# Patient Record
Sex: Male | Born: 1985 | Race: Black or African American | Hispanic: No | Marital: Single | Smoking: Never smoker
Health system: Southern US, Community
[De-identification: ages and names within clinical notes are randomized; demographics above are authoritative.]

## PROBLEM LIST (undated history)

## (undated) DIAGNOSIS — R21 Rash and other nonspecific skin eruption: Secondary | ICD-10-CM

## (undated) DIAGNOSIS — H538 Other visual disturbances: Secondary | ICD-10-CM

## (undated) DIAGNOSIS — E785 Hyperlipidemia, unspecified: Secondary | ICD-10-CM

## (undated) DIAGNOSIS — J45909 Unspecified asthma, uncomplicated: Secondary | ICD-10-CM

## (undated) DIAGNOSIS — Z8619 Personal history of other infectious and parasitic diseases: Secondary | ICD-10-CM

## (undated) HISTORY — DX: Rash and other nonspecific skin eruption: R21

## (undated) HISTORY — DX: Other visual disturbances: H53.8

## (undated) HISTORY — DX: Personal history of other infectious and parasitic diseases: Z86.19

## (undated) HISTORY — DX: Hyperlipidemia, unspecified: E78.5

---
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
## 2006-07-28 ENCOUNTER — Ambulatory Visit: Payer: Self-pay | Admitting: Internal Medicine

## 2006-07-28 LAB — CONVERTED CEMR LAB
ALT: 20 units/L (ref 0–40)
AST: 23 units/L (ref 0–37)
Albumin: 4.5 g/dL (ref 3.5–5.2)
Alkaline Phosphatase: 51 units/L (ref 39–117)
BUN: 13 mg/dL (ref 6–23)
Basophils Absolute: 0 10*3/uL (ref 0.0–0.1)
Basophils Relative: 0.5 % (ref 0.0–1.0)
Bilirubin Urine: NEGATIVE
CO2: 30 meq/L (ref 19–32)
Calcium: 9.6 mg/dL (ref 8.4–10.5)
Chloride: 103 meq/L (ref 96–112)
Creatinine, Ser: 1.3 mg/dL (ref 0.4–1.5)
Crystals: NEGATIVE
Eosinophil percent: 0.8 % (ref 0.0–5.0)
GFR calc non Af Amer: 76 mL/min
Glomerular Filtration Rate, Af Am: 91 mL/min/{1.73_m2}
Glucose, Bld: 99 mg/dL (ref 70–99)
HCT: 48.5 % (ref 39.0–52.0)
Hemoglobin, Urine: NEGATIVE
Hemoglobin: 16.2 g/dL (ref 13.0–17.0)
Ketones, ur: NEGATIVE mg/dL
Leukocytes, UA: NEGATIVE
Lymphocytes Relative: 22.3 % (ref 12.0–46.0)
MCHC: 33.3 g/dL (ref 30.0–36.0)
MCV: 96.9 fL (ref 78.0–100.0)
Monocytes Absolute: 0.3 10*3/uL (ref 0.2–0.7)
Monocytes Relative: 4 % (ref 3.0–11.0)
Mucus, UA: NEGATIVE
Neutro Abs: 5.6 10*3/uL (ref 1.4–7.7)
Neutrophils Relative %: 72.4 % (ref 43.0–77.0)
Nitrite: NEGATIVE
Platelets: 224 10*3/uL (ref 150–400)
Potassium: 4.1 meq/L (ref 3.5–5.1)
RBC: 5.01 M/uL (ref 4.22–5.81)
RDW: 12 % (ref 11.5–14.6)
Sodium: 140 meq/L (ref 135–145)
Specific Gravity, Urine: 1.025 (ref 1.000–1.03)
TSH: 1.13 microintl units/mL (ref 0.35–5.50)
Total Bilirubin: 1.6 mg/dL — ABNORMAL HIGH (ref 0.3–1.2)
Total Protein, Urine: NEGATIVE mg/dL
Total Protein: 7.5 g/dL (ref 6.0–8.3)
Urine Glucose: NEGATIVE mg/dL
Urobilinogen, UA: 0.2 (ref 0.0–1.0)
WBC: 7.7 10*3/uL (ref 4.5–10.5)
pH: 7 (ref 5.0–8.0)

## 2006-08-02 ENCOUNTER — Encounter: Admission: RE | Admit: 2006-08-02 | Discharge: 2006-08-02 | Payer: Self-pay | Admitting: Internal Medicine

## 2006-12-08 ENCOUNTER — Ambulatory Visit: Payer: Self-pay | Admitting: Internal Medicine

## 2006-12-08 LAB — CONVERTED CEMR LAB
ALT: 23 units/L (ref 0–40)
AST: 22 units/L (ref 0–37)
Albumin: 4.1 g/dL (ref 3.5–5.2)
Alkaline Phosphatase: 47 units/L (ref 39–117)
BUN: 14 mg/dL (ref 6–23)
Bacteria, UA: NEGATIVE
Basophils Absolute: 0 10*3/uL (ref 0.0–0.1)
Basophils Relative: 0 % (ref 0.0–1.0)
Bilirubin, Direct: 0.2 mg/dL (ref 0.0–0.3)
CO2: 31 meq/L (ref 19–32)
Calcium: 9.5 mg/dL (ref 8.4–10.5)
Chloride: 102 meq/L (ref 96–112)
Cholesterol: 164 mg/dL (ref 0–200)
Creatinine, Ser: 1.3 mg/dL (ref 0.4–1.5)
Crystals: NEGATIVE
Eosinophils Absolute: 0.1 10*3/uL (ref 0.0–0.6)
Eosinophils Relative: 0.6 % (ref 0.0–5.0)
GFR calc Af Amer: 91 mL/min
GFR calc non Af Amer: 75 mL/min
Glucose, Bld: 99 mg/dL (ref 70–99)
HCT: 50.3 % (ref 39.0–52.0)
HDL: 50.7 mg/dL (ref >39.0)
Hemoglobin: 16.9 g/dL (ref 13.0–17.0)
Ketones, ur: NEGATIVE mg/dL
LDL Cholesterol: 105 mg/dL — ABNORMAL HIGH (ref 0–99)
Leukocytes, UA: NEGATIVE
Lymphocytes Relative: 15.1 % (ref 12.0–46.0)
MCHC: 33.6 g/dL (ref 30.0–36.0)
MCV: 96.8 fL (ref 78.0–100.0)
Monocytes Absolute: 0.7 10*3/uL (ref 0.2–0.7)
Monocytes Relative: 6.1 % (ref 3.0–11.0)
Neutro Abs: 8.7 10*3/uL — ABNORMAL HIGH (ref 1.4–7.7)
Neutrophils Relative %: 78.2 % — ABNORMAL HIGH (ref 43.0–77.0)
Nitrite: NEGATIVE
Platelets: 218 10*3/uL (ref 150–400)
Potassium: 4.4 meq/L (ref 3.5–5.1)
RBC: 5.2 M/uL (ref 4.22–5.81)
RDW: 12.1 % (ref 11.5–14.6)
Sodium: 141 meq/L (ref 135–145)
Specific Gravity, Urine: >=1.03 (ref 1.000–1.03)
TSH: 1.25 microintl units/mL (ref 0.35–5.50)
Total Bilirubin: 1.3 mg/dL — ABNORMAL HIGH (ref 0.3–1.2)
Total CHOL/HDL Ratio: 3.2
Total Protein, Urine: 30 mg/dL — AB
Total Protein: 7.5 g/dL (ref 6.0–8.3)
Triglycerides: 40 mg/dL (ref 0–149)
Urine Glucose: NEGATIVE mg/dL
Urobilinogen, UA: 0.2 (ref 0.0–1.0)
VLDL: 8 mg/dL (ref 0–40)
WBC, UA: NONE SEEN cells/hpf
WBC: 11.2 10*3/uL — ABNORMAL HIGH (ref 4.5–10.5)
pH: 6 (ref 5.0–8.0)

## 2007-01-07 ENCOUNTER — Ambulatory Visit: Payer: Self-pay | Admitting: Internal Medicine

## 2007-01-07 LAB — CONVERTED CEMR LAB: Mono Screen: POSITIVE — AB

## 2007-05-27 ENCOUNTER — Encounter: Payer: Self-pay | Admitting: *Deleted

## 2007-05-27 DIAGNOSIS — Z8619 Personal history of other infectious and parasitic diseases: Secondary | ICD-10-CM

## 2007-05-27 HISTORY — DX: Personal history of other infectious and parasitic diseases: Z86.19

## 2009-06-29 ENCOUNTER — Ambulatory Visit: Payer: Self-pay | Admitting: Diagnostic Radiology

## 2009-06-29 ENCOUNTER — Telehealth: Payer: Self-pay | Admitting: Internal Medicine

## 2009-06-29 ENCOUNTER — Emergency Department (HOSPITAL_BASED_OUTPATIENT_CLINIC_OR_DEPARTMENT_OTHER): Admission: EM | Admit: 2009-06-29 | Discharge: 2009-06-29 | Payer: Self-pay | Admitting: Emergency Medicine

## 2010-09-15 ENCOUNTER — Ambulatory Visit: Payer: Self-pay | Admitting: Internal Medicine

## 2010-09-15 ENCOUNTER — Encounter: Payer: Self-pay | Admitting: Internal Medicine

## 2010-09-15 DIAGNOSIS — H538 Other visual disturbances: Secondary | ICD-10-CM

## 2010-09-15 DIAGNOSIS — E785 Hyperlipidemia, unspecified: Secondary | ICD-10-CM | POA: Insufficient documentation

## 2010-09-15 DIAGNOSIS — R21 Rash and other nonspecific skin eruption: Secondary | ICD-10-CM

## 2010-09-15 HISTORY — DX: Hyperlipidemia, unspecified: E78.5

## 2010-09-15 HISTORY — DX: Rash and other nonspecific skin eruption: R21

## 2010-09-15 HISTORY — DX: Other visual disturbances: H53.8

## 2010-09-15 LAB — CONVERTED CEMR LAB
Albumin: 4.6 g/dL (ref 3.5–5.2)
Alkaline Phosphatase: 50 units/L (ref 39–117)
Basophils Absolute: 0 10*3/uL (ref 0.0–0.1)
Bilirubin Urine: NEGATIVE
Bilirubin, Direct: 0.2 mg/dL (ref 0.0–0.3)
CO2: 31 meq/L (ref 19–32)
Calcium: 9.9 mg/dL (ref 8.4–10.5)
Creatinine, Ser: 1.2 mg/dL (ref 0.4–1.5)
Eosinophils Absolute: 0.1 10*3/uL (ref 0.0–0.7)
Glucose, Bld: 87 mg/dL (ref 70–99)
HDL: 58.8 mg/dL (ref >39.00)
Ketones, ur: NEGATIVE mg/dL
Leukocytes, UA: NEGATIVE
Lymphocytes Relative: 23.4 % (ref 12.0–46.0)
MCHC: 34.2 g/dL (ref 30.0–36.0)
Neutrophils Relative %: 70.2 % (ref 43.0–77.0)
Platelets: 240 10*3/uL (ref 150.0–400.0)
RDW: 12.9 % (ref 11.5–14.6)
TSH: 1.31 microintl units/mL (ref 0.35–5.50)
Total CHOL/HDL Ratio: 3
VLDL: 8 mg/dL (ref 0.0–40.0)
pH: 7 (ref 5.0–8.0)

## 2010-09-16 LAB — CONVERTED CEMR LAB: Chlamydia, Swab/Urine, PCR: NEGATIVE

## 2010-10-30 NOTE — Assessment & Plan Note (Signed)
Summary: NEW PT CXP/ TO RE EST/ LABS NEXT DAY/NWS  #   Vital Signs:  Patient profile:   25 year old male Height:      70 inches Weight:      187.75 pounds BMI:     27.04 O2 Sat:      98 % on Room air Temp:     97.9 degrees F oral Pulse rate:   62 / minute BP sitting:   110 / 80  (left arm) Cuff size:   regular  Vitals Entered By: Zella Ball Ewing CMA (AAMA) (September 15, 2010 1:11 PM)  O2 Flow:  Room air  CC: ADult Physical/RE   CC:  ADult Physical/RE.  History of Present Illness: here for wellness ;  Pt denies CP, worsening sob, doe, wheezing, orthopnea, pnd, worsening LE edema, palps, dizziness or syncope  Pt denies new neuro symptoms such as headache, facial or extremity weakness .  Pt denies polydipsia, polyuria.    Overall good compliance with meds, not reallt trying to follow low chol diet, wt stable, little excercise however .  Denies worsening depressive symptoms, suicidal ideation, or panic.   No fever, wt loss, night sweats, loss of appetite or other constitutional symptoms  Overall good compliance with meds, and good tolerability.  Pt states good ability with ADL's, low fall risk, home safety reviewed and adequate, no significant change in hearing or vision, trying to follow lower chol diet, and occasionally active only with regular excercise.  Does have "spots" to the vision bilat with getting a hug, but o/w no other compalints Has optho appt next wk  Preventive Screening-Counseling & Management  Alcohol-Tobacco     Smoking Status: never      Drug Use:  no.    Problems Prior to Update: 1)  Preventive Health Care  (ICD-V70.0) 2)  Blurred Vision, Intermittent  (ICD-368.8) 3)  Hyperlipidemia  (ICD-272.4) 4)  Rash and Other Nonspecific Skin Eruption  (ICD-782.1) 5)  Infectious Mononucleosis, Hx of  (ICD-V12.09)  Medications Prior to Update: 1)  None  Current Medications (verified): 1)  None  Allergies (verified): 1)  ! Penicillin  Past History:  Family  History: Last updated: 09/15/2010 father with DM grandparent with arthritis., heart disease, HTN, DM, stroke great uncle with ETOH brother s/p renal transplant  Social History: Last updated: 09/15/2010 work - Retail banker - Surveyor, [employer] - [employer] Single no children Alcohol use-yes - social Never Smoked Drug use-no  Risk Factors: Smoking Status: never (09/15/2010)  Past Medical History: hx of mono 2008 Hyperlipidemia  Past Surgical History: Reviewed history from 05/27/2007 and no changes required. Denies surgical history  Family History: Reviewed history and no changes required. father with DM grandparent with arthritis., heart disease, HTN, DM, stroke great uncle with ETOH brother s/p renal transplant  Social History: Reviewed history and no changes required. work - Pension scheme manager - [employer] Single no children Alcohol use-yes - social Never Smoked Drug use-no Drug Use:  no  Review of Systems  The patient denies anorexia, fever, vision loss, decreased hearing, hoarseness, chest pain, syncope, dyspnea on exertion, peripheral edema, prolonged cough, headaches, hemoptysis, abdominal pain, melena, hematochezia, severe indigestion/heartburn, hematuria, muscle weakness, suspicious skin lesions, transient blindness, difficulty walking, depression, unusual weight change, abnormal bleeding, enlarged lymph nodes, and angioedema.         all otherwise negative per pt -  except for recurring sore to glans penis in the past 6 mo, requests STD tests  Physical  Exam  General:  alert and overweight-appearing.   Head:  normocephalic and atraumatic.   Eyes:  vision grossly intact, pupils equal, pupils round, pupils reactive to light, and no optic disk abnormalities.   Ears:  R ear normal and L ear normal.   Nose:  no external deformity and no nasal discharge.   Mouth:  no gingival abnormalities and pharynx pink and moist.   Neck:  supple and no masses.    Lungs:  normal respiratory effort and normal breath sounds.   Heart:  normal rate and regular rhythm.   Abdomen:  soft, non-tender, and normal bowel sounds.   Genitalia:  Testes bilaterally descended without nodularity, tenderness or masses. No scrotal masses or lesions. No penis lesions or urethral discharge. Msk:  no joint tenderness and no joint swelling.   Extremities:  no edema, no erythema  Neurologic:  strength normal in all extremities, sensation intact to light touch, and gait normal.     Impression & Recommendations:  Problem # 1:  Preventive Health Care (ICD-V70.0) Overall doing well, age appropriate education and counseling updated, referral for preventive services and immunizations addressed, dietary counseling and smoking status adressed , most recent labs reviewed I have personally reviewed and have noted 1.The patient's medical and social history 2.Their use of alcohol, tobacco or illicit drugs 3.Their current medications and supplements 4. Functional ability including ADL's, fall risk, home safety risk, hearing & visual impairment  5.Diet and physical activities 6.Evidence for depression or mood disorders The patients weight, height, BMI  have been recorded in the chart I have made referrals, counseling and provided education to the patient based review of the above  Orders: TLB-BMP (Basic Metabolic Panel-BMET) (80048-METABOL) TLB-CBC Platelet - w/Differential (85025-CBCD) TLB-Hepatic/Liver Function Pnl (80076-HEPATIC) TLB-Lipid Panel (80061-LIPID) TLB-TSH (Thyroid Stimulating Hormone) (84443-TSH) TLB-Udip ONLY (81003-UDIP)  Problem # 2:  RASH AND OTHER NONSPECIFIC SKIN ERUPTION (ICD-782.1) no rash today, but recurring - for STD tests per pt reqeust Orders: T-HIV-1 (Screen) (95621) T-Syphilis Test (RPR) [id number]) T-Herpes Simplex Type 2 (62952-84132) T-Chlamydia & GC Probe, Urine (87491/[address])  Problem # 3:  BLURRED VISION, INTERMITTENT (ICD-368.8) ?  clinical signifcance - I enecourage pt to keep his optho appt next wk as planned  Other Orders: Tdap => 64yrs IM (44010) Admin 1st Vaccine (27253)  Patient Instructions: 1)  you had the tetanus shot today 2)  Please go to the Lab in the basement for your blood and/or urine tests today, including for the STD's 3)  Please call the number on the [facility] Card for results of your testing 4)  Please schedule a follow-up appointment in 1 year, or sooner if needed   Orders Added: 1)  T-HIV-1 (Screen) [86701] 2)  T-Syphilis Test (RPR) [66440-34742] 3)  T-Herpes Simplex Type 2 [86696-81071] 4)  T-Chlamydia & GC Probe, Urine [87491/[address]] 5)  Tdap => 15yrs IM [90715] 6)  Admin 1st Vaccine [90471] 7)  TLB-BMP (Basic Metabolic Panel-BMET) [80048-METABOL] 8)  TLB-CBC Platelet - w/Differential [85025-CBCD] 9)  TLB-Hepatic/Liver Function Pnl [80076-HEPATIC] 10)  TLB-Lipid Panel [80061-LIPID] 11)  TLB-TSH (Thyroid Stimulating Hormone) [84443-TSH] 12)  TLB-Udip ONLY [81003-UDIP] 13)  New Patient 18-39 years [99385]   Immunizations Administered:  Tetanus Vaccine:    Vaccine Type: Tdap    Site: left deltoid    Mfr: GlaxoSmithKline    Dose: 0.5 ml    Route: IM    Given by: Zella Ball Ewing CMA (AAMA)    Exp. Date: 07/17/2012    Lot #: [id number]  VIS given: 08/15/08 version given September 15, 2010.   Immunizations Administered:  Tetanus Vaccine:    Vaccine Type: Tdap    Site: left deltoid    Mfr: GlaxoSmithKline    Dose: 0.5 ml    Route: IM    Given by: Zella Ball Ewing CMA (AAMA)    Exp. Date: 07/17/2012    Lot #: [id number]    VIS given: 08/15/08 version given September 15, 2010.

## 2011-01-01 LAB — DIFFERENTIAL
Basophils Absolute: 0.1 10*3/uL (ref 0.0–0.1)
Basophils Relative: 1 % (ref 0–1)
Eosinophils Relative: 4 % (ref 0–5)
Monocytes Absolute: 0.4 10*3/uL (ref 0.1–1.0)

## 2011-01-01 LAB — BASIC METABOLIC PANEL
CO2: 31 mEq/L (ref 19–32)
Chloride: 102 mEq/L (ref 96–112)
Glucose, Bld: 86 mg/dL (ref 70–99)
Potassium: 4.4 mEq/L (ref 3.5–5.1)
Sodium: 141 mEq/L (ref 135–145)

## 2011-01-01 LAB — URINALYSIS, ROUTINE W REFLEX MICROSCOPIC
Bilirubin Urine: NEGATIVE
Hgb urine dipstick: NEGATIVE
Specific Gravity, Urine: 1.02 (ref 1.005–1.030)
Urobilinogen, UA: 0.2 mg/dL (ref 0.0–1.0)
pH: 8 (ref 5.0–8.0)

## 2011-01-01 LAB — CBC
HCT: 45.8 % (ref 39.0–52.0)
Hemoglobin: 15.6 g/dL (ref 13.0–17.0)
MCHC: 34.1 g/dL (ref 30.0–36.0)
MCV: 94.8 fL (ref 78.0–100.0)
RDW: 12.4 % (ref 11.5–15.5)

## 2011-02-19 ENCOUNTER — Emergency Department (HOSPITAL_BASED_OUTPATIENT_CLINIC_OR_DEPARTMENT_OTHER)
Admission: EM | Admit: 2011-02-19 | Discharge: 2011-02-19 | Disposition: A | Discharge status: Home / Self Care | Payer: 59 | Attending: Emergency Medicine | Admitting: Emergency Medicine

## 2011-02-19 DIAGNOSIS — L02419 Cutaneous abscess of limb, unspecified: Secondary | ICD-10-CM | POA: Insufficient documentation

## 2011-05-06 ENCOUNTER — Emergency Department (INDEPENDENT_AMBULATORY_CARE_PROVIDER_SITE_OTHER): Payer: 59

## 2011-05-06 ENCOUNTER — Emergency Department (HOSPITAL_BASED_OUTPATIENT_CLINIC_OR_DEPARTMENT_OTHER)
Admission: EM | Admit: 2011-05-06 | Discharge: 2011-05-07 | Disposition: A | Discharge status: Home / Self Care | Payer: 59 | Attending: Emergency Medicine | Admitting: Emergency Medicine

## 2011-05-06 ENCOUNTER — Encounter: Payer: Self-pay | Admitting: *Deleted

## 2011-05-06 DIAGNOSIS — Y92410 Unspecified street and highway as the place of occurrence of the external cause: Secondary | ICD-10-CM | POA: Insufficient documentation

## 2011-05-06 DIAGNOSIS — S20219A Contusion of unspecified front wall of thorax, initial encounter: Secondary | ICD-10-CM | POA: Insufficient documentation

## 2011-05-06 DIAGNOSIS — R079 Chest pain, unspecified: Secondary | ICD-10-CM

## 2011-05-06 DIAGNOSIS — R109 Unspecified abdominal pain: Secondary | ICD-10-CM
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
## 2011-05-07 LAB — URINE MICROSCOPIC-ADD ON

## 2011-05-07 LAB — URINALYSIS, ROUTINE W REFLEX MICROSCOPIC
Leukocytes, UA: NEGATIVE
Nitrite: NEGATIVE
Specific Gravity, Urine: 1.027 (ref 1.005–1.030)
Urobilinogen, UA: 0.2 mg/dL (ref 0.0–1.0)

## 2011-05-07 MED ORDER — OXYCODONE-ACETAMINOPHEN 5-325 MG PO TABS: 2.0000 | ORAL_TABLET | ORAL | Status: AC | PRN

## 2011-05-07 MED ORDER — DIAZEPAM 5 MG PO TABS: 5.0000 mg | ORAL_TABLET | Freq: Two times a day (BID) | ORAL | Status: AC

## 2011-05-07 MED ORDER — IBUPROFEN 600 MG PO TABS: 600.0000 mg | ORAL_TABLET | Freq: Four times a day (QID) | ORAL | Status: AC | PRN

## 2011-10-06 ENCOUNTER — Encounter: Payer: Self-pay | Admitting: Internal Medicine

## 2011-10-06 DIAGNOSIS — Z0001 Encounter for general adult medical examination with abnormal findings: Secondary | ICD-10-CM | POA: Insufficient documentation

## 2011-10-09 ENCOUNTER — Ambulatory Visit (INDEPENDENT_AMBULATORY_CARE_PROVIDER_SITE_OTHER): Payer: 59 | Admitting: Internal Medicine

## 2011-10-09 ENCOUNTER — Other Ambulatory Visit (INDEPENDENT_AMBULATORY_CARE_PROVIDER_SITE_OTHER): Payer: No Typology Code available for payment source

## 2011-10-09 VITALS — BP 128/86 | HR 62 | Temp 97.5°F | Wt 207.0 lb

## 2011-10-09 DIAGNOSIS — Z0000 Encounter for general adult medical examination without abnormal findings: Secondary | ICD-10-CM

## 2011-10-09 DIAGNOSIS — R062 Wheezing: Secondary | ICD-10-CM

## 2011-10-09 DIAGNOSIS — R42 Dizziness and giddiness: Secondary | ICD-10-CM | POA: Insufficient documentation

## 2011-10-09 DIAGNOSIS — J209 Acute bronchitis, unspecified: Secondary | ICD-10-CM | POA: Insufficient documentation

## 2011-10-09 LAB — BASIC METABOLIC PANEL
BUN: 11 mg/dL (ref 6–23)
CO2: 28 mEq/L (ref 19–32)
Calcium: 9.2 mg/dL (ref 8.4–10.5)
GFR: 104.94 mL/min (ref >60.00)
Glucose, Bld: 91 mg/dL (ref 70–99)
Potassium: 4.3 mEq/L (ref 3.5–5.1)
Sodium: 141 mEq/L (ref 135–145)

## 2011-10-09 LAB — HEPATIC FUNCTION PANEL
AST: 32 U/L (ref 0–37)
Albumin: 4.4 g/dL (ref 3.5–5.2)
Alkaline Phosphatase: 55 U/L (ref 39–117)
Total Protein: 7.4 g/dL (ref 6.0–8.3)

## 2011-10-09 LAB — CBC WITH DIFFERENTIAL/PLATELET
Basophils Absolute: 0 10*3/uL (ref 0.0–0.1)
Eosinophils Relative: 1 % (ref 0.0–5.0)
HCT: 44.5 % (ref 39.0–52.0)
Lymphocytes Relative: 29.2 % (ref 12.0–46.0)
Monocytes Relative: 6 % (ref 3.0–12.0)
Platelets: 211 10*3/uL (ref 150.0–400.0)
RDW: 13 % (ref 11.5–14.6)
WBC: 7.8 10*3/uL (ref 4.5–10.5)

## 2011-10-09 LAB — URINALYSIS, ROUTINE W REFLEX MICROSCOPIC
Leukocytes, UA: NEGATIVE
Nitrite: NEGATIVE
Specific Gravity, Urine: 1.02 (ref 1.000–1.030)
Urobilinogen, UA: 0.2 (ref 0.0–1.0)
pH: 7 (ref 5.0–8.0)

## 2011-10-09 LAB — TSH: TSH: 1.58 u[IU]/mL (ref 0.35–5.50)

## 2011-10-09 LAB — LIPID PANEL: Triglycerides: 75 mg/dL (ref 0.0–149.0)

## 2011-10-09 MED ORDER — AZITHROMYCIN 250 MG PO TABS: ORAL_TABLET | ORAL | Status: AC

## 2011-10-09 MED ORDER — PREDNISONE 10 MG PO TABS: 10.0000 mg | ORAL_TABLET | Freq: Every day | ORAL | Status: DC

## 2011-10-09 MED ORDER — HYDROCODONE-HOMATROPINE 5-1.5 MG/5ML PO SYRP: 5.0000 mL | ORAL_SOLUTION | Freq: Four times a day (QID) | ORAL | Status: AC | PRN

## 2011-10-09 NOTE — Patient Instructions (Signed)
Take all new medications as prescribed Continue all other medications as before Your EKG was OK today You will be contacted regarding the referral for: echocardiogram Please go to LAB in the Basement for the blood and/or urine tests to be done today Please call the phone number [phone] (the [employer]Tree System) for results of testing in 2-3 days;  When calling, simply dial the number, and when prompted enter the MRN number above (the Medical Record Number) and the # key, then the message should start. Please return in 3 weeks, or sooner if needed

## 2011-10-09 NOTE — Assessment & Plan Note (Addendum)
ECG reviewed as per emr, exam benign, ? Clinical significance, for echo, and most recent labs reviewwed with pt  Lab Results  Component Value Date   WBC 7.8 10/09/2011   HGB 15.3 10/09/2011   HCT 44.5 10/09/2011   PLT 211.0 10/09/2011   GLUCOSE 91 10/09/2011   CHOL 186 10/09/2011   TRIG 75.0 10/09/2011   HDL 45.00 10/09/2011   LDLCALC 126* 10/09/2011   ALT 47 10/09/2011   AST 32 10/09/2011   NA 141 10/09/2011   K 4.3 10/09/2011   CL 104 10/09/2011   CREATININE 1.1 10/09/2011   BUN 11 10/09/2011   CO2 28 10/09/2011   TSH 1.58 10/09/2011

## 2011-10-11 ENCOUNTER — Encounter: Payer: Self-pay | Admitting: Internal Medicine

## 2011-10-11 NOTE — Progress Notes (Signed)
  Subjective:    Patient ID: Ryan White, male    DOB: 1986-09-13, 26 y.o.   MRN: [id number]  HPI  Here with acute onset mild to mod 2-3 days ST, HA, general weakness and malaise, with prod cough greenish sputum, but Pt denies chest pain, increased sob or doe, wheezing, orthopnea, PND, increased LE swelling, palpitations, or syncope, except for onset wheezing today.  Pt denies new neurological symptoms such as new headache, or facial or extremity weakness or numbness   Pt denies polydipsia, polyuria.  Also with c/o ongoing unusual dizziness with hugs or anything that seems to increaese pressure to chest such as valsalva maneuver.  Has been over a yr, but more freq and severe lately, quite concenred,  No actual syncope, but each episode leads to "stars" in the vision, goes away in 1-2 minutes.  Nothing else makes better or worse.  No prior hx of CV dz. Past Medical History  Diagnosis Date  . BLURRED VISION, INTERMITTENT 09/15/2010  . HYPERLIPIDEMIA 09/15/2010  . INFECTIOUS MONONUCLEOSIS, HX OF 05/27/2007  . Rash and other nonspecific skin eruption 09/15/2010   No past surgical history on file.  reports that he has never smoked. He does not have any smokeless tobacco history on file. He reports that he does not drink alcohol or use illicit drugs. family history is not on file. Allergies  Allergen Reactions  . Penicillins Hives   Current Outpatient Prescriptions on File Prior to Visit  Medication Sig Dispense Refill  . Multiple Vitamin (MULTIVITAMIN) tablet Take 1 tablet by mouth daily.         Review of Systems Review of Systems  Constitutional: Negative for diaphoresis and unexpected weight change.  HENT: Negative for drooling and tinnitus.   Eyes: Negative for photophobia and visual disturbance.  Respiratory: Negative for choking and stridor.   Gastrointestinal: Negative for vomiting and blood in stool.  Genitourinary: Negative for hematuria and decreased urine volume.  Musculoskeletal:  Negative for gait problem.  Skin: Negative for color change and wound.  Neurological: Negative for tremors and numbness.  Psychiatric/Behavioral: Negative for decreased concentration. The patient is not hyperactive.       Objective:   Physical Exam BP 128/86  Pulse 62  Temp(Src) 97.5 F (36.4 C) (Oral)  Wt 207 lb (93.895 kg)  SpO2 97% Physical Exam  VS noted, mild ill Constitutional: Pt appears well-developed and well-nourished.  HENT: Head: Normocephalic.  Right Ear: External ear normal.  Left Ear: External ear normal.  Bilat tm's mild erythema.  Sinus nontender.  Pharynx mild erythema Eyes: Conjunctivae and EOM are normal. Pupils are equal, round, and reactive to light.  Neck: Normal range of motion. Neck supple.  Cardiovascular: Normal rate and regular rhythm.  No murmur noted or heave Pulmonary/Chest: Effort normal and breath sounds mild decreased, few wheeze bilat.  Abd:  Soft, NT, non-distended, + BS Neurological: Pt is alert. No cranial nerve deficit.  Skin: Skin is warm. No erythema.  Psychiatric: Pt behavior is normal. Thought content normal. 1+ nervous    Assessment & Plan:

## 2011-10-11 NOTE — Assessment & Plan Note (Signed)
Mild to mod, for predpack,  to f/u any worsening symptoms or concerns 

## 2011-10-11 NOTE — Assessment & Plan Note (Signed)
Mild to mod, for antibx course,  to f/u any worsening symptoms or concerns 

## 2011-10-19 ENCOUNTER — Ambulatory Visit (HOSPITAL_COMMUNITY): Payer: 59 | Attending: Cardiology | Admitting: Radiology

## 2011-10-19 DIAGNOSIS — R42 Dizziness and giddiness: Secondary | ICD-10-CM | POA: Insufficient documentation

## 2011-10-19 DIAGNOSIS — R0609 Other forms of dyspnea: Secondary | ICD-10-CM | POA: Insufficient documentation

## 2011-10-19 DIAGNOSIS — E785 Hyperlipidemia, unspecified: Secondary | ICD-10-CM | POA: Insufficient documentation

## 2011-10-19 DIAGNOSIS — R0602 Shortness of breath: Secondary | ICD-10-CM

## 2011-10-19 DIAGNOSIS — R0989 Other specified symptoms and signs involving the circulatory and respiratory systems: Secondary | ICD-10-CM | POA: Insufficient documentation

## 2011-11-16 ENCOUNTER — Encounter: Payer: Self-pay | Admitting: Internal Medicine

## 2011-11-16 ENCOUNTER — Ambulatory Visit (INDEPENDENT_AMBULATORY_CARE_PROVIDER_SITE_OTHER): Payer: 59 | Admitting: Internal Medicine

## 2011-11-16 ENCOUNTER — Other Ambulatory Visit (INDEPENDENT_AMBULATORY_CARE_PROVIDER_SITE_OTHER): Payer: 59

## 2011-11-16 VITALS — BP 94/72 | HR 57 | Temp 97.8°F | Ht 70.0 in | Wt 207.8 lb

## 2011-11-16 DIAGNOSIS — E785 Hyperlipidemia, unspecified: Secondary | ICD-10-CM

## 2011-11-16 DIAGNOSIS — Z0000 Encounter for general adult medical examination without abnormal findings: Secondary | ICD-10-CM

## 2011-11-16 DIAGNOSIS — J45909 Unspecified asthma, uncomplicated: Secondary | ICD-10-CM | POA: Insufficient documentation

## 2011-11-16 LAB — LIPID PANEL
LDL Cholesterol: 121 mg/dL — ABNORMAL HIGH (ref 0–99)
Total CHOL/HDL Ratio: 4
Triglycerides: 42 mg/dL (ref 0.0–149.0)

## 2011-11-16 MED ORDER — FLUTICASONE-SALMETEROL 250-50 MCG/DOSE IN AEPB: 1.0000 | INHALATION_SPRAY | Freq: Two times a day (BID) | RESPIRATORY_TRACT | Status: DC

## 2011-11-16 MED ORDER — ALBUTEROL SULFATE HFA 108 (90 BASE) MCG/ACT IN AERS: 2.0000 | INHALATION_SPRAY | Freq: Four times a day (QID) | RESPIRATORY_TRACT | Status: DC | PRN

## 2011-11-16 MED ORDER — MONTELUKAST SODIUM 10 MG PO TABS: 10.0000 mg | ORAL_TABLET | Freq: Every day | ORAL | Status: DC

## 2011-11-16 NOTE — Assessment & Plan Note (Addendum)
Mild uncontrolled, for cont'd low chol diet, tochekc lipids repeat

## 2011-11-16 NOTE — Assessment & Plan Note (Signed)
Mixed etiology it seems excericse but also intermittent, cant r/o anxiety as cause of dyspnea but seems less likely;  For advair and ventolin hfa prn, pt educated on admin today

## 2011-11-16 NOTE — Progress Notes (Signed)
Subjective:    Patient ID: Ryan White, male    DOB: 1986/01/24, 26 y.o.   MRN: [id number]  HPI  Here for wellness and f/u;  Overall doing ok;  Pt denies CP, worsening SOB, DOE, wheezing, orthopnea, PND, worsening LE edema, palpitations, dizziness or syncope, except for exercise as well as other wheezing/sob in the past 2-3 months.  Pt denies neurological change such as new Headache, facial or extremity weakness.  Pt denies polydipsia, polyuria, or low sugar symptoms. Pt states overall good compliance with treatment and medications, good tolerability, and trying to follow lower cholesterol diet.  Pt denies worsening depressive symptoms, suicidal ideation or panic. No fever, wt loss, night sweats, loss of appetite, or other constitutional symptoms.  Pt states good ability with ADL's, low fall risk, home safety reviewed and adequate, no significant changes in hearing or vision, and occasionally active with exercise.  Has really been trying to follow lower chol diet in the past month.  Past Medical History  Diagnosis Date  . BLURRED VISION, INTERMITTENT 09/15/2010  . HYPERLIPIDEMIA 09/15/2010  . INFECTIOUS MONONUCLEOSIS, HX OF 05/27/2007  . Rash and other nonspecific skin eruption 09/15/2010   No past surgical history on file.  reports that he has never smoked. He does not have any smokeless tobacco history on file. He reports that he does not drink alcohol or use illicit drugs. family history is not on file. Allergies  Allergen Reactions  . Penicillins Hives   Current Outpatient Prescriptions on File Prior to Visit  Medication Sig Dispense Refill  . Multiple Vitamin (MULTIVITAMIN) tablet Take 1 tablet by mouth daily.         Review of Systems Review of Systems  Constitutional: Negative for diaphoresis, activity change, appetite change and unexpected weight change.  HENT: Negative for hearing loss, ear pain, facial swelling, mouth sores and neck stiffness.   Eyes: Negative for pain, redness  and visual disturbance.  Respiratory: Negative for shortness of breath and wheezing.   Cardiovascular: Negative for chest pain and palpitations.  Gastrointestinal: Negative for diarrhea, blood in stool, abdominal distention and rectal pain.  Genitourinary: Negative for hematuria, flank pain and decreased urine volume.  Musculoskeletal: Negative for myalgias and joint swelling.  Skin: Negative for color change and wound.  Neurological: Negative for syncope and numbness.  Hematological: Negative for adenopathy.  Psychiatric/Behavioral: Negative for hallucinations, self-injury, decreased concentration and agitation.      Objective:   Physical Exam BP 94/72  Pulse 57  Temp(Src) 97.8 F (36.6 C) (Oral)  Ht 5\' 10"  (1.778 m)  Wt 207 lb 12.8 oz (94.257 kg)  BMI 29.82 kg/m2  SpO2 98% Physical Exam  VS noted Constitutional: Pt is oriented to person, place, and time. Appears well-developed and well-nourished.  HENT:  Head: Normocephalic and atraumatic.  Right Ear: External ear normal.  Left Ear: External ear normal.  Nose: Nose normal.  Mouth/Throat: Oropharynx is clear and moist.  Eyes: Conjunctivae and EOM are normal. Pupils are equal, round, and reactive to light.  Neck: Normal range of motion. Neck supple. No JVD present. No tracheal deviation present.  Cardiovascular: Normal rate, regular rhythm, normal heart sounds and intact distal pulses.   Pulmonary/Chest: Effort normal and breath sounds normal.  Abdominal: Soft. Bowel sounds are normal. There is no tenderness.  Musculoskeletal: Normal range of motion. Exhibits no edema.  Lymphadenopathy:  Has no cervical adenopathy.  Neurological: Pt is alert and oriented to person, place, and time. Pt has normal reflexes. No cranial nerve  deficit.  Skin: Skin is warm and dry. No rash noted.  Psychiatric:  Has  normal mood and affect. Behavior is normal. 1+ nervous    Assessment & Plan:

## 2011-11-16 NOTE — Assessment & Plan Note (Signed)

## 2011-11-16 NOTE — Patient Instructions (Addendum)
Please use the Advair sample twice per day on a regular basis for 2 wks until the sample is done Take all new medications as prescribed - the generic singulair every day OK to use the advair prescription on an ongoing basis, otherwise the singulair alone may be enough Please use the Ventolin HFA inhaler as needed with exercise or other wheezing as needed Please go to LAB in the Basement for the blood and/or urine tests to be done today Please call the phone number [phone] (the PhoneTree System) for results of testing in 2-3 days;  When calling, simply dial the number, and when prompted enter the MRN number above (the Medical Record Number) and the # key, then the message should start.

## 2014-02-07 ENCOUNTER — Ambulatory Visit (INDEPENDENT_AMBULATORY_CARE_PROVIDER_SITE_OTHER): Payer: 59 | Admitting: Internal Medicine

## 2014-02-07 ENCOUNTER — Encounter: Payer: Self-pay | Admitting: Internal Medicine

## 2014-02-07 VITALS — BP 114/70 | HR 67 | Temp 98.0°F | Ht 70.0 in | Wt 223.2 lb

## 2014-02-07 DIAGNOSIS — A64 Unspecified sexually transmitted disease: Secondary | ICD-10-CM

## 2014-02-07 DIAGNOSIS — J45909 Unspecified asthma, uncomplicated: Secondary | ICD-10-CM

## 2014-02-07 DIAGNOSIS — N452 Orchitis: Secondary | ICD-10-CM | POA: Insufficient documentation

## 2014-02-07 DIAGNOSIS — N453 Epididymo-orchitis: Secondary | ICD-10-CM

## 2014-02-07 DIAGNOSIS — Z0000 Encounter for general adult medical examination without abnormal findings: Secondary | ICD-10-CM

## 2014-02-07 DIAGNOSIS — I83893 Varicose veins of bilateral lower extremities with other complications: Secondary | ICD-10-CM | POA: Insufficient documentation

## 2014-02-07 MED ORDER — DOXYCYCLINE HYCLATE 100 MG PO TABS: 100.0000 mg | ORAL_TABLET | Freq: Two times a day (BID) | ORAL | Status: DC

## 2014-02-07 NOTE — Assessment & Plan Note (Signed)
Mild to mod, for antibx course,  to f/u any worsening symptoms or concerns 

## 2014-02-07 NOTE — Progress Notes (Signed)
   Subjective:    Patient ID: Ryan White, male    DOB: 06/02/1986, 28 y.o.   MRN: [id number]  HPI  Here with 2 wks onset mild bilat aching testicles with mild swelling, Denies urinary symptoms such as dysuria, frequency, urgency, flank pain, hematuria or n/v, fever, chills. Also has 2 areas of tender/pain superficial varicose veins to right lateral thigh and post right calf. Pt denies chest pain, increased sob or doe, wheezing, orthopnea, PND, increased LE swelling, palpitations, dizziness or syncope.   Pt denies polydipsia, polyuria,  Past Medical History  Diagnosis Date  . BLURRED VISION, INTERMITTENT 09/15/2010  . HYPERLIPIDEMIA 09/15/2010  . INFECTIOUS MONONUCLEOSIS, HX OF 05/27/2007  . Rash and other nonspecific skin eruption 09/15/2010   No past surgical history on file.  reports that he has never smoked. He does not have any smokeless tobacco history on file. He reports that he does not drink alcohol or use illicit drugs. family history is not on file. Allergies  Allergen Reactions  . Penicillins Hives   Current Outpatient Prescriptions on File Prior to Visit  Medication Sig Dispense Refill  . Multiple Vitamin (MULTIVITAMIN) tablet Take 1 tablet by mouth daily.        Marland Kitchen. albuterol (PROVENTIL HFA;VENTOLIN HFA) 108 (90 BASE) MCG/ACT inhaler Inhale 2 puffs into the lungs every 6 (six) hours as needed for wheezing.  1 Inhaler  5  . montelukast (SINGULAIR) 10 MG tablet Take 1 tablet (10 mg total) by mouth daily.  30 tablet  11   No current facility-administered medications on file prior to visit.   Review of Systems  Constitutional: Negative for unusual diaphoresis or other sweats  HENT: Negative for ringing in ear Eyes: Negative for double vision or worsening visual disturbance.  Respiratory: Negative for choking and stridor.   Gastrointestinal: Negative for vomiting or other signifcant bowel change Genitourinary: Negative for hematuria or decreased urine volume.    Musculoskeletal: Negative for other MSK pain or swelling Skin: Negative for color change and worsening wound.  Neurological: Negative for tremors and numbness other than noted  Psychiatric/Behavioral: Negative for decreased concentration or agitation other than above       Objective:   Physical Exam BP 114/70  Pulse 67  Temp(Src) 98 F (36.7 C) (Oral)  Ht 5\' 10"  (1.778 m)  Wt 223 lb 4 oz (101.266 kg)  BMI 32.03 kg/m2  SpO2 96% VS noted,  Constitutional: Pt appears well-developed, well-nourished.  HENT: Head: NCAT.  Right Ear: External ear normal.  Left Ear: External ear normal.  Eyes: . Pupils are equal, round, and reactive to light. Conjunctivae and EOM are normal Neck: Normal range of motion. Neck supple.  Cardiovascular: Normal rate and regular rhythm.   Pulmonary/Chest: Effort normal and breath sounds normal.  Neurological: Pt is alert. Not confused , motor grossly intact Skin: Skin is warm. No rash, 2 tender areas superfic varicosities right lateral thigh and right post thigh Psychiatric: Pt behavior is normal. No agitation.     Assessment & Plan:

## 2014-02-07 NOTE — Assessment & Plan Note (Signed)
stable overall by history and exam, recent data reviewed with pt, and pt to continue medical treatment as before,  to f/u any worsening symptoms or concerns SpO2 Readings from Last 3 Encounters:  02/07/14 96%  11/16/11 98%  10/09/11 97%

## 2014-02-07 NOTE — Progress Notes (Signed)
Pre visit review using our clinic review tool, if applicable. No additional management support is needed unless otherwise documented below in the visit note. 

## 2014-02-07 NOTE — Patient Instructions (Signed)
Please take all new medication as prescribed - the antibiotic  Please continue all other medications as before, and refills have been done if requested. Please have the pharmacy call with any other refills you may need.  You will be contacted regarding the referral for: vein clinic  Please return in 1 months, or sooner if needed, with lab done 3-5 days ahead

## 2014-02-07 NOTE — Assessment & Plan Note (Signed)
Continue pain, for vein clinic referral for symptomatic varicosities

## 2014-03-07 ENCOUNTER — Ambulatory Visit: Payer: 59 | Admitting: Internal Medicine

## 2014-03-14 ENCOUNTER — Encounter: Payer: Self-pay | Admitting: Internal Medicine

## 2014-03-14 ENCOUNTER — Other Ambulatory Visit (INDEPENDENT_AMBULATORY_CARE_PROVIDER_SITE_OTHER): Payer: 59

## 2014-03-14 ENCOUNTER — Ambulatory Visit (INDEPENDENT_AMBULATORY_CARE_PROVIDER_SITE_OTHER): Payer: 59 | Admitting: Internal Medicine

## 2014-03-14 VITALS — BP 102/66 | HR 73 | Temp 97.6°F | Ht 70.0 in | Wt 225.0 lb

## 2014-03-14 DIAGNOSIS — J45909 Unspecified asthma, uncomplicated: Secondary | ICD-10-CM

## 2014-03-14 DIAGNOSIS — A64 Unspecified sexually transmitted disease: Secondary | ICD-10-CM

## 2014-03-14 DIAGNOSIS — Z0000 Encounter for general adult medical examination without abnormal findings: Secondary | ICD-10-CM

## 2014-03-14 LAB — URINALYSIS, ROUTINE W REFLEX MICROSCOPIC
BILIRUBIN URINE: NEGATIVE
Ketones, ur: NEGATIVE
Leukocytes, UA: NEGATIVE
NITRITE: NEGATIVE
Specific Gravity, Urine: >=1.03 — AB (ref 1.000–1.030)
TOTAL PROTEIN, URINE-UPE24: NEGATIVE
Urine Glucose: NEGATIVE
Urobilinogen, UA: 0.2 (ref 0.0–1.0)
pH: 6 (ref 5.0–8.0)

## 2014-03-14 LAB — LIPID PANEL
CHOL/HDL RATIO: 5
Cholesterol: 173 mg/dL (ref 0–200)
HDL: 36.9 mg/dL — ABNORMAL LOW (ref >39.00)
LDL Cholesterol: 124 mg/dL — ABNORMAL HIGH (ref 0–99)
NONHDL: 136.1
Triglycerides: 62 mg/dL (ref 0.0–149.0)
VLDL: 12.4 mg/dL (ref 0.0–40.0)

## 2014-03-14 LAB — BASIC METABOLIC PANEL
BUN: 14 mg/dL (ref 6–23)
CALCIUM: 9.2 mg/dL (ref 8.4–10.5)
CHLORIDE: 106 meq/L (ref 96–112)
CO2: 30 mEq/L (ref 19–32)
CREATININE: 1.2 mg/dL (ref 0.4–1.5)
GFR: 96.86 mL/min (ref >60.00)
Glucose, Bld: 107 mg/dL — ABNORMAL HIGH (ref 70–99)
Potassium: 4 mEq/L (ref 3.5–5.1)
Sodium: 140 mEq/L (ref 135–145)

## 2014-03-14 LAB — HEPATIC FUNCTION PANEL
ALT: 34 U/L (ref 0–53)
AST: 34 U/L (ref 0–37)
Albumin: 4.1 g/dL (ref 3.5–5.2)
Alkaline Phosphatase: 55 U/L (ref 39–117)
BILIRUBIN TOTAL: 0.7 mg/dL (ref 0.2–1.2)
Bilirubin, Direct: 0.1 mg/dL (ref 0.0–0.3)
Total Protein: 7.2 g/dL (ref 6.0–8.3)

## 2014-03-14 LAB — CBC WITH DIFFERENTIAL/PLATELET
BASOS PCT: 0.3 % (ref 0.0–3.0)
Basophils Absolute: 0 10*3/uL (ref 0.0–0.1)
EOS PCT: 1.7 % (ref 0.0–5.0)
Eosinophils Absolute: 0.2 10*3/uL (ref 0.0–0.7)
HCT: 44.7 % (ref 39.0–52.0)
HEMOGLOBIN: 15.1 g/dL (ref 13.0–17.0)
Lymphocytes Relative: 21.3 % (ref 12.0–46.0)
Lymphs Abs: 2.1 10*3/uL (ref 0.7–4.0)
MCHC: 33.8 g/dL (ref 30.0–36.0)
MCV: 92.6 fl (ref 78.0–100.0)
MONO ABS: 0.4 10*3/uL (ref 0.1–1.0)
Monocytes Relative: 3.8 % (ref 3.0–12.0)
NEUTROS PCT: 72.9 % (ref 43.0–77.0)
Neutro Abs: 7.2 10*3/uL (ref 1.4–7.7)
Platelets: 261 10*3/uL (ref 150.0–400.0)
RBC: 4.83 Mil/uL (ref 4.22–5.81)
RDW: 13.1 % (ref 11.5–15.5)
WBC: 9.9 10*3/uL (ref 4.0–10.5)

## 2014-03-14 LAB — TSH: TSH: 1.47 u[IU]/mL (ref 0.35–4.50)

## 2014-03-14 MED ORDER — MONTELUKAST SODIUM 10 MG PO TABS: 10.0000 mg | ORAL_TABLET | Freq: Every day | ORAL | Status: DC

## 2014-03-14 NOTE — Assessment & Plan Note (Signed)

## 2014-03-14 NOTE — Progress Notes (Signed)
Subjective:    Patient ID: Ryan MaesJoshua J Beckles, male    DOB: 11/07/1985, 28 y.o.   MRN: [id number]  HPI  Here for wellness and f/u;  Overall doing ok;  Pt denies CP, worsening SOB, DOE, wheezing, orthopnea, PND, worsening LE edema, palpitations, dizziness or syncope.  Pt denies neurological change such as new headache, facial or extremity weakness.  Pt denies polydipsia, polyuria, or low sugar symptoms. Pt states overall good compliance with treatment and medications, good tolerability, and has been trying to follow lower cholesterol diet.  Pt denies worsening depressive symptoms, suicidal ideation or panic. No fever, night sweats, wt loss, loss of appetite, or other constitutional symptoms.  Pt states good ability with ADL's, has low fall risk, home safety reviewed and adequate, no other significant changes in hearing or vision, and only occasionally active with exercise. Has gained wt from 180 to now 225 over last few yrs, often working 12-14 hr days Past Medical History  Diagnosis Date  . BLURRED VISION, INTERMITTENT 09/15/2010  . HYPERLIPIDEMIA 09/15/2010  . INFECTIOUS MONONUCLEOSIS, HX OF 05/27/2007  . Rash and other nonspecific skin eruption 09/15/2010   No past surgical history on file.  reports that he has never smoked. He does not have any smokeless tobacco history on file. He reports that he does not drink alcohol or use illicit drugs. family history is not on file. Allergies  Allergen Reactions  . Penicillins Hives   Current Outpatient Prescriptions on File Prior to Visit  Medication Sig Dispense Refill  . Multiple Vitamin (MULTIVITAMIN) tablet Take 1 tablet by mouth daily.        Marland Kitchen. albuterol (PROVENTIL HFA;VENTOLIN HFA) 108 (90 BASE) MCG/ACT inhaler Inhale 2 puffs into the lungs every 6 (six) hours as needed for wheezing.  1 Inhaler  5  . montelukast (SINGULAIR) 10 MG tablet Take 1 tablet (10 mg total) by mouth daily.  30 tablet  11   No current facility-administered medications on  file prior to visit.   Review of Systems Constitutional: Negative for increased diaphoresis, other activity, appetite or other siginficant weight change  HENT: Negative for worsening hearing loss, ear pain, facial swelling, mouth sores and neck stiffness.   Eyes: Negative for other worsening pain, redness or visual disturbance.  Respiratory: Negative for shortness of breath and wheezing.   Cardiovascular: Negative for chest pain and palpitations.  Gastrointestinal: Negative for diarrhea, blood in stool, abdominal distention or other pain Genitourinary: Negative for hematuria, flank pain or change in urine volume.  Musculoskeletal: Negative for myalgias or other joint complaints.  Skin: Negative for color change and wound.  Neurological: Negative for syncope and numbness. other than noted Hematological: Negative for adenopathy. or other swelling Psychiatric/Behavioral: Negative for hallucinations, self-injury, decreased concentration or other worsening agitation.      Objective:   Physical Exam BP 102/66  Pulse 73  Temp(Src) 97.6 F (36.4 C) (Oral)  Ht 5\' 10"  (1.778 m)  Wt 225 lb (102.059 kg)  BMI 32.28 kg/m2  SpO2 95% VS noted,  Constitutional: Pt is oriented to person, place, and time. Appears well-developed and well-nourished.  Head: Normocephalic and atraumatic.  Right Ear: External ear normal.  Left Ear: External ear normal.  Nose: Nose normal.  Mouth/Throat: Oropharynx is clear and moist.  Eyes: Conjunctivae and EOM are normal. Pupils are equal, round, and reactive to light.  Neck: Normal range of motion. Neck supple. No JVD present. No tracheal deviation present.  Cardiovascular: Normal rate, regular rhythm, normal heart sounds and  intact distal pulses.   Pulmonary/Chest: Effort normal and breath sounds without rales or wheezing  Abdominal: Soft. Bowel sounds are normal. NT. No HSM  Musculoskeletal: Normal range of motion. Exhibits no edema.  Lymphadenopathy:  Has no  cervical adenopathy.  Neurological: Pt is alert and oriented to person, place, and time. Pt has normal reflexes. No cranial nerve deficit. Motor grossly intact Skin: Skin is warm and dry. No rash noted.  Psychiatric:  Has normal mood and affect. Behavior is normal.     Assessment & Plan:

## 2014-03-14 NOTE — Patient Instructions (Signed)
Please continue all other medications as before, and refills have been done if requested.  Please have the pharmacy call with any other refills you may need.  Please continue your efforts at being more active, low cholesterol diet, and weight control.  You are otherwise up to date with prevention measures today.  Please keep your appointments with your specialists as you may have planned  We will need to send you a letter regarding the tests that are still pending at this time.

## 2014-03-14 NOTE — Addendum Note (Signed)
Addended by: Corwin LevinsJOHN, JAMES W on: 03/14/2014 03:24 PM   Modules accepted: Orders

## 2014-03-14 NOTE — Progress Notes (Signed)
Pre visit review using our clinic review tool, if applicable. No additional management support is needed unless otherwise documented below in the visit note. 

## 2014-03-15 ENCOUNTER — Encounter: Payer: Self-pay | Admitting: Internal Medicine

## 2014-03-15 LAB — HEPATITIS PANEL, ACUTE
HCV AB: NEGATIVE
HEP B C IGM: NONREACTIVE
HEP B S AG: NEGATIVE
Hep A IgM: NONREACTIVE

## 2014-03-15 LAB — HSV 2 ANTIBODY, IGG

## 2014-03-15 LAB — RPR

## 2014-03-15 LAB — HIV ANTIBODY (ROUTINE TESTING W REFLEX): HIV 1&2 Ab, 4th Generation: NONREACTIVE

## 2014-03-17 LAB — GC/CHLAMYDIA PROBE AMP, URINE
Chlamydia, Swab/Urine, PCR: NEGATIVE
GC Probe Amp, Urine: NEGATIVE

## 2014-03-28 ENCOUNTER — Other Ambulatory Visit: Payer: Self-pay | Admitting: *Deleted

## 2014-03-28 DIAGNOSIS — I83893 Varicose veins of bilateral lower extremities with other complications: Secondary | ICD-10-CM

## 2014-03-29 ENCOUNTER — Encounter: Payer: 59 | Admitting: Vascular Surgery

## 2014-03-29 ENCOUNTER — Encounter (HOSPITAL_COMMUNITY): Payer: 59

## 2014-04-11 ENCOUNTER — Encounter (HOSPITAL_COMMUNITY): Payer: 59

## 2014-04-11 ENCOUNTER — Encounter: Payer: 59 | Admitting: Vascular Surgery

## 2014-05-15 ENCOUNTER — Encounter: Payer: Self-pay | Admitting: Vascular Surgery

## 2014-05-16 ENCOUNTER — Encounter: Payer: Self-pay | Admitting: Vascular Surgery

## 2014-05-16 ENCOUNTER — Ambulatory Visit (HOSPITAL_COMMUNITY)
Admission: RE | Admit: 2014-05-16 | Discharge: 2014-05-16 | Disposition: A | Discharge status: Home / Self Care | Payer: 59 | Source: Ambulatory Visit | Attending: Vascular Surgery | Admitting: Vascular Surgery

## 2014-05-16 ENCOUNTER — Ambulatory Visit (INDEPENDENT_AMBULATORY_CARE_PROVIDER_SITE_OTHER): Payer: 59 | Admitting: Vascular Surgery

## 2014-05-16 VITALS — BP 116/61 | HR 66 | Ht 70.0 in | Wt 225.0 lb

## 2014-05-16 DIAGNOSIS — I83893 Varicose veins of bilateral lower extremities with other complications: Secondary | ICD-10-CM | POA: Diagnosis not present

## 2014-05-16 NOTE — Progress Notes (Signed)
Referred by:  Corwin Levins, MD [address] [place], [place] [address]  Reason for referral: Swollen right leg  History of Present Illness  Ryan White is a 28 y.o. (1986/08/23) male who presents with chief complaint: swollen leg in the right thigh.  Patient notes, onset of swelling 16 months ago, associated with sitting for prolonged periods of.  The patient's symptoms include: swelling and pain with touch.  The patient has had no history of DVT,no history of varicose vein, no history of venous stasis ulcers, no history of  Lymphedema and no history of skin changes in lower legs.  There is no family history of venous disorders.  The patient has not used compression stockings in the past.  His past medical history includes Asthma.  He denise DM, hypertension and hypercholesterolemia.    Past Medical History  Diagnosis Date  . BLURRED VISION, INTERMITTENT 09/15/2010  . HYPERLIPIDEMIA 09/15/2010  . INFECTIOUS MONONUCLEOSIS, HX OF 05/27/2007  . Rash and other nonspecific skin eruption 09/15/2010    History reviewed. No pertinent past surgical history.  History   Social History  . Marital Status: Single    Spouse Name: N/A    Number of Children: N/A  . Years of Education: N/A   Occupational History  . Not on file.   Social History Main Topics  . Smoking status: Never Smoker   . Smokeless tobacco: Not on file  . Alcohol Use: No  . Drug Use: No  . Sexual Activity: No   Other Topics Concern  . Not on file   Social History Narrative  . No narrative on file    History reviewed. No pertinent family history. Father healthy Mother healthy  Current Outpatient Prescriptions on File Prior to Visit  Medication Sig Dispense Refill  . montelukast (SINGULAIR) 10 MG tablet Take 1 tablet (10 mg total) by mouth daily.  30 tablet  11  . Multiple Vitamin (MULTIVITAMIN) tablet Take 1 tablet by mouth daily.        Marland Kitchen albuterol (PROVENTIL HFA;VENTOLIN HFA) 108 (90 BASE) MCG/ACT  inhaler Inhale 2 puffs into the lungs every 6 (six) hours as needed for wheezing.  1 Inhaler  5   No current facility-administered medications on file prior to visit.    Allergies  Allergen Reactions  . Penicillins Hives      REVIEW OF SYSTEMS:  (Positives checked otherwise negative)  CARDIOVASCULAR:  []  chest pain, []  chest pressure, []  palpitations, []  shortness of breath when laying flat, []  shortness of breath with exertion,  []  pain in feet when walking, []  pain in feet when laying flat, []  history of blood clot in veins (DVT), []  history of phlebitis, []  swelling in legs, []  varicose veins  PULMONARY:  []  productive cough, [x]  asthma, []  wheezing  NEUROLOGIC:  []  weakness in arms or legs, []  numbness in arms or legs, []  difficulty speaking or slurred speech, []  temporary loss of vision in one eye, []  dizziness  HEMATOLOGIC:  []  bleeding problems, []  problems with blood clotting too easily  MUSCULOSKEL:  []  joint pain, []  joint swelling  GASTROINTEST:  []  vomiting blood, []  blood in stool     GENITOURINARY:  []  burning with urination, []  blood in urine  PSYCHIATRIC:  []  history of major depression  INTEGUMENTARY:  []  rashes, []  ulcers  CONSTITUTIONAL:  []  fever, []  chills   Physical Examination Filed Vitals:   05/16/14 1345  BP: 116/61  Pulse: 66  Height:  5\' 10"  (1.778 m)  Weight: 225 lb (102.059 kg)  SpO2: 98%   Body mass index is 32.28 kg/(m^2).  General: A&O x 3, WDWN  Head: Page  Ear/Nose/Throat: Hearing grossly intact, nares w/o erythema or drainage, oropharynx w/o Erythema/Exudate  Eyes: PERRLA  Neck: Supple, no nuchal rigidity, no palpable LAD  Pulmonary: Sym exp, good air movt, CTAB, no rales, rhonchi, & wheezing  Cardiac: RRR, Nl S1, S2, no Murmurs, rubs or gallops  Vascular: Vessel Right Left  Radial Palpable Palpable  Brachial Palpable Palpable  Carotid Palpable, without bruit Palpable, without bruit  Aorta Not palpable N/A  Femoral  Palpable Palpable  Popliteal Not palpable Not palpable  PT Palpable Palpable  DP Palpable Palpable   Gastrointestinal: soft, NTND  Musculoskeletal: M/S 5/5 throughout  Neurologic: CN 2-12 intact  Psychiatric: Judgment intact, Mood & affect appropriate for pt's clinical situation  Dermatologic: See M/S exam for extremity exam, no rashes otherwise noted.  Right lateral thigh palpable tender vein.    Lymph : No Cervical, Axillary, or Inguinal lymphadenopathy   Non-Invasive Vascular Imaging  BLE Venous Duplex (Date: 05/16/2014):   No DVT  Right Common femoral vein reflux   Medical Decision Making  Dyke MaesJoshua J Ulrich is a 28 y.o. male who presents with: RLE  "spider veins" dilated capularies.    Based on the patient's history and examination, Dr. Edilia Boickson recommend: Walking throughout his day, elevation at home with his legs above the level of his heart.  He can follow up as needed.  [facility] Office: [phone]  05/16/2014, 2:17 PM  Agree with above. He has some telangiectasias in his lateral right thigh and lateral right leg. There is no significant deep vein reflux or reflux in the greater saphenous vein. We have discussed the importance of intermittent leg elevation. He sits  A lot at work and I've encouraged him to get up and walk as much as possible.  Waverly Ferrarihristopher Dickson, MD, FACS Beeper [phone] 05/16/2014

## 2014-07-14 ENCOUNTER — Emergency Department (HOSPITAL_BASED_OUTPATIENT_CLINIC_OR_DEPARTMENT_OTHER)
Admission: EM | Admit: 2014-07-14 | Discharge: 2014-07-15 | Disposition: A | Discharge status: Home / Self Care | Payer: 59 | Attending: Emergency Medicine | Admitting: Emergency Medicine

## 2014-07-14 DIAGNOSIS — Z79899 Other long term (current) drug therapy: Secondary | ICD-10-CM | POA: Insufficient documentation

## 2014-07-14 DIAGNOSIS — R109 Unspecified abdominal pain: Secondary | ICD-10-CM | POA: Insufficient documentation

## 2014-07-14 DIAGNOSIS — Z8619 Personal history of other infectious and parasitic diseases: Secondary | ICD-10-CM | POA: Insufficient documentation

## 2014-07-14 DIAGNOSIS — J45909 Unspecified asthma, uncomplicated: Secondary | ICD-10-CM | POA: Diagnosis not present

## 2014-07-14 DIAGNOSIS — T7840XA Allergy, unspecified, initial encounter: Secondary | ICD-10-CM | POA: Diagnosis not present

## 2014-07-14 DIAGNOSIS — Z8639 Personal history of other endocrine, nutritional and metabolic disease: Secondary | ICD-10-CM | POA: Insufficient documentation

## 2014-07-14 DIAGNOSIS — R21 Rash and other nonspecific skin eruption: Secondary | ICD-10-CM | POA: Insufficient documentation

## 2014-07-14 DIAGNOSIS — F419 Anxiety disorder, unspecified: Secondary | ICD-10-CM | POA: Diagnosis not present

## 2014-07-14 DIAGNOSIS — R197 Diarrhea, unspecified: Secondary | ICD-10-CM | POA: Insufficient documentation

## 2014-07-14 HISTORY — DX: Unspecified asthma, uncomplicated: J45.909

## 2014-07-14 MED ORDER — METHYLPREDNISOLONE SODIUM SUCC 125 MG IJ SOLR
125.0000 mg | Freq: Once | INTRAMUSCULAR | Status: AC
  Administered 2014-07-15: 125 mg via INTRAMUSCULAR
  Filled 2014-07-14: qty 2

## 2014-07-14 MED ORDER — DIPHENHYDRAMINE HCL 25 MG PO CAPS
25.0000 mg | ORAL_CAPSULE | Freq: Once | ORAL | Status: AC
  Administered 2014-07-15: 25 mg via ORAL
  Filled 2014-07-14: qty 1

## 2014-07-14 MED ORDER — FAMOTIDINE 20 MG PO TABS
20.0000 mg | ORAL_TABLET | Freq: Once | ORAL | Status: AC
  Administered 2014-07-15: 20 mg via ORAL
  Filled 2014-07-14: qty 1

## 2014-07-15 ENCOUNTER — Encounter (HOSPITAL_BASED_OUTPATIENT_CLINIC_OR_DEPARTMENT_OTHER): Payer: Self-pay | Admitting: Emergency Medicine

## 2014-07-15 MED ORDER — PREDNISONE 20 MG PO TABS: ORAL_TABLET | ORAL | Status: DC

## 2014-07-15 MED ORDER — DIPHENHYDRAMINE HCL 25 MG PO TABS: 25.0000 mg | ORAL_TABLET | Freq: Four times a day (QID) | ORAL | Status: DC | PRN

## 2014-07-15 NOTE — Discharge Instructions (Signed)

## 2014-07-15 NOTE — ED Provider Notes (Signed)
CSN: [id number]     Arrival date & time 07/14/14  2340 History   First MD Initiated Contact with Patient 07/14/14 2352     Chief Complaint  Patient presents with  . Allergic Reaction     (Consider location/radiation/quality/duration/timing/severity/associated sxs/prior Treatment) HPI Patient presents with possible allergic reaction starting about one hour ago. He states the symptoms started with itching and burning to the bottoms of his feet and palms of his hand. He developed a raised rash to his trunk and her thighs. He also describes tongue tingling and sensation of throat swelling. He developed no wheezing or stridor. He also had some mild abdominal cramping and diarrhea. Since onset of his symptoms have dramatically improved. He currently has no respiratory symptoms. He denies any intraoral swelling. Rash has improved. No known new exposures. No sick contacts. Previous history of penicillin allergy. Past Medical History  Diagnosis Date  . BLURRED VISION, INTERMITTENT 09/15/2010  . HYPERLIPIDEMIA 09/15/2010  . INFECTIOUS MONONUCLEOSIS, HX OF 05/27/2007  . Rash and other nonspecific skin eruption 09/15/2010  . Asthma    History reviewed. No pertinent past surgical history. No family history on file. History  Substance Use Topics  . Smoking status: Never Smoker   . Smokeless tobacco: Not on file  . Alcohol Use: Yes     Comment: occasional     Review of Systems  Constitutional: Negative for fever and chills.  HENT: Negative for facial swelling, sore throat and trouble swallowing.   Respiratory: Negative for shortness of breath, wheezing and stridor.   Gastrointestinal: Positive for abdominal pain and diarrhea. Negative for nausea and vomiting.  Musculoskeletal: Negative for back pain, myalgias, neck pain and neck stiffness.  Skin: Positive for rash.  Neurological: Negative for dizziness, weakness, light-headedness, numbness and headaches.  All other systems reviewed and are  negative.     Allergies  Penicillins  Home Medications   Prior to Admission medications   Medication Sig Start Date End Date Taking? Authorizing Provider  albuterol (PROVENTIL HFA;VENTOLIN HFA) 108 (90 BASE) MCG/ACT inhaler Inhale 2 puffs into the lungs every 6 (six) hours as needed for wheezing. 11/16/11 11/15/12  Corwin LevinsJames W John, MD  montelukast (SINGULAIR) 10 MG tablet Take 1 tablet (10 mg total) by mouth daily. 03/14/14 03/14/15  Corwin LevinsJames W John, MD  Multiple Vitamin (MULTIVITAMIN) tablet Take 1 tablet by mouth daily.      Historical Provider, MD   BP 144/68  Pulse 73  Temp(Src) 98.1 F (36.7 C) (Oral)  Resp 16  Ht 5\' 10"  (1.778 m)  Wt 225 lb (102.059 kg)  BMI 32.28 kg/m2  SpO2 98% Physical Exam  Nursing note and vitals reviewed. Constitutional: He is oriented to person, place, and time. He appears well-developed and well-nourished. No distress.  Mildly anxious appearing  HENT:  Head: Normocephalic and atraumatic.  Mouth/Throat: Oropharynx is clear and moist. No oropharyngeal exudate.  The tongue or intraoral swelling. Speech is clear.  Eyes: EOM are normal. Pupils are equal, round, and reactive to light.  Neck: Normal range of motion. Neck supple.  Cardiovascular: Normal rate and regular rhythm.   Pulmonary/Chest: Effort normal and breath sounds normal. No stridor. No respiratory distress. He has no wheezes. He has no rales. He exhibits no tenderness.  Abdominal: Soft. Bowel sounds are normal. He exhibits no distension and no mass. There is no tenderness. There is no rebound and no guarding.  Musculoskeletal: Normal range of motion. He exhibits no edema and no tenderness.  Neurological: He is alert  and oriented to person, place, and time.  Skin: Skin is warm and dry. Rash noted. No erythema.  Erythematous plaques to thoracic back. Patient has erythematous macules to the palms of hands and the soles of feet.  Psychiatric: He has a normal mood and affect. His behavior is normal.     ED Course  Procedures (including critical care time) Labs Review Labs Reviewed - No data to display  Imaging Review No results found.   EKG Interpretation None      MDM   Final diagnoses:  None    Symptoms are consistent with with allergic reaction. No new known exposures. We'll treat and monitor in the emergency department. Patient's airway is intact with no intraoral swelling.  Observed 2 hours in the emergency department. Improvement of rash. Airway intact. Return precautions given  Loren Raceravid Pennie Vanblarcom, MD 07/15/14 [phone]

## 2014-07-15 NOTE — ED Notes (Signed)
C/o burning feeling to feet bilateral and hands bilateral. States his mouth felt tingling/itching. States he had hives to his legs and ant chest. Has not eaten anything today. Denies any new products. States he feels a little sob. resp even and unlabored. No distress. States he works at a [employer] and this happened after he shook his customers hand.

## 2014-11-08 ENCOUNTER — Ambulatory Visit (INDEPENDENT_AMBULATORY_CARE_PROVIDER_SITE_OTHER): Payer: Managed Care, Other (non HMO) | Admitting: Internal Medicine

## 2014-11-08 ENCOUNTER — Other Ambulatory Visit (INDEPENDENT_AMBULATORY_CARE_PROVIDER_SITE_OTHER): Payer: Managed Care, Other (non HMO)

## 2014-11-08 ENCOUNTER — Encounter: Payer: Self-pay | Admitting: Internal Medicine

## 2014-11-08 VITALS — BP 110/76 | HR 77 | Temp 98.6°F | Ht 70.0 in | Wt 230.0 lb

## 2014-11-08 DIAGNOSIS — R1030 Lower abdominal pain, unspecified: Secondary | ICD-10-CM | POA: Insufficient documentation

## 2014-11-08 DIAGNOSIS — I889 Nonspecific lymphadenitis, unspecified: Secondary | ICD-10-CM

## 2014-11-08 DIAGNOSIS — L509 Urticaria, unspecified: Secondary | ICD-10-CM

## 2014-11-08 DIAGNOSIS — R1031 Right lower quadrant pain: Secondary | ICD-10-CM

## 2014-11-08 LAB — BASIC METABOLIC PANEL
BUN: 16 mg/dL (ref 6–23)
CO2: 30 meq/L (ref 19–32)
CREATININE: 1.2 mg/dL (ref 0.40–1.50)
Calcium: 9.3 mg/dL (ref 8.4–10.5)
Chloride: 104 mEq/L (ref 96–112)
GFR: 92.7 mL/min (ref >60.00)
Glucose, Bld: 87 mg/dL (ref 70–99)
Potassium: 4.1 mEq/L (ref 3.5–5.1)
Sodium: 139 mEq/L (ref 135–145)

## 2014-11-08 LAB — URINALYSIS, ROUTINE W REFLEX MICROSCOPIC
Bilirubin Urine: NEGATIVE
KETONES UR: NEGATIVE
Leukocytes, UA: NEGATIVE
Nitrite: NEGATIVE
Specific Gravity, Urine: >=1.03 — AB (ref 1.000–1.030)
Total Protein, Urine: NEGATIVE
UROBILINOGEN UA: 0.2 (ref 0.0–1.0)
Urine Glucose: NEGATIVE
pH: 6 (ref 5.0–8.0)

## 2014-11-08 LAB — CBC WITH DIFFERENTIAL/PLATELET
Basophils Absolute: 0.1 10*3/uL (ref 0.0–0.1)
Basophils Relative: 0.5 % (ref 0.0–3.0)
EOS PCT: 0.4 % (ref 0.0–5.0)
Eosinophils Absolute: 0.1 10*3/uL (ref 0.0–0.7)
HCT: 45.4 % (ref 39.0–52.0)
HEMOGLOBIN: 15.9 g/dL (ref 13.0–17.0)
LYMPHS ABS: 3.1 10*3/uL (ref 0.7–4.0)
Lymphocytes Relative: 24.2 % (ref 12.0–46.0)
MCHC: 35.1 g/dL (ref 30.0–36.0)
MCV: 91.5 fl (ref 78.0–100.0)
Monocytes Absolute: 0.6 10*3/uL (ref 0.1–1.0)
Monocytes Relative: 5 % (ref 3.0–12.0)
NEUTROS ABS: 9 10*3/uL — AB (ref 1.4–7.7)
Neutrophils Relative %: 69.9 % (ref 43.0–77.0)
PLATELETS: 250 10*3/uL (ref 150.0–400.0)
RBC: 4.97 Mil/uL (ref 4.22–5.81)
RDW: 13.5 % (ref 11.5–15.5)
WBC: 12.8 10*3/uL — ABNORMAL HIGH (ref 4.0–10.5)

## 2014-11-08 LAB — HEPATIC FUNCTION PANEL
ALT: 35 U/L (ref 0–53)
AST: 29 U/L (ref 0–37)
Albumin: 4.4 g/dL (ref 3.5–5.2)
Alkaline Phosphatase: 53 U/L (ref 39–117)
BILIRUBIN DIRECT: 0.3 mg/dL (ref 0.0–0.3)
BILIRUBIN TOTAL: 1.3 mg/dL — AB (ref 0.2–1.2)
TOTAL PROTEIN: 7.2 g/dL (ref 6.0–8.3)

## 2014-11-08 LAB — LIPASE: Lipase: 12 U/L (ref 11.0–59.0)

## 2014-11-08 MED ORDER — METHYLPREDNISOLONE ACETATE 40 MG/ML INJ SUSP (RADIOLOG: 120.0000 mg | Freq: Once | INTRAMUSCULAR | Status: DC

## 2014-11-08 MED ORDER — METHYLPREDNISOLONE ACETATE 80 MG/ML IJ SUSP
80.0000 mg | Freq: Once | INTRAMUSCULAR | Status: AC
Start: 2014-11-08 — End: 2014-11-08
  Administered 2014-11-08: 80 mg via INTRAMUSCULAR

## 2014-11-08 MED ORDER — LEVOFLOXACIN 500 MG PO TABS: 500.0000 mg | ORAL_TABLET | Freq: Every day | ORAL | Status: DC

## 2014-11-08 MED ORDER — PREDNISONE 10 MG PO TABS: ORAL_TABLET | ORAL | Status: DC

## 2014-11-08 NOTE — Assessment & Plan Note (Addendum)
Pt with cellphone pic evidence of typical wheal and flare, with itching , ongoing x 3 wks, no obvious cause such as meds or food, for depomedrol IM today, predpac asd, consider allergy referral or derm referral, also for benadryl otc prn itching

## 2014-11-08 NOTE — Progress Notes (Signed)
Subjective:    Patient ID: Ryan White, male    DOB: August 25, 1986, 29 y.o.   MRN: [id number]  HPI  Here to fu with several c/o's, has hive like reaction ongoing for 3 wks, seen in ER oct 18 with an allergic rxn as well, but has several pictures to document typical wheal and flare rash, usually for some reason comes up each evening, ususally in the sides of the abd at the mid axillary lines, but also today with a feeling of throat swelling, no real pain, no wheezing, sob or actual rash at this time, but does have right sided lymphadenitis like appearance with pre and post SCM LN chains tender, swelling without overlying skin change.  Also mentions what sounds like a recent spont drained cyst to right prox inner thigh now resolved, Also has a small area left medial thigh irritative rash that is different from other rash ? Due to thigh rubbing.  Also has 2 wks onset RLQ pain with decreased appetite without significant wt loss, mild nausea but no vomiting, no fever he is aware and Denies worsening reflux, other abd pain, dysphagia, bowel change or blood. No prior hx of same, no prior hx of appendix, diverticulitis, abscess or other.    Also incidentally has ongoing groin pain/testicle pain for many months, asks for urology referral Past Medical History  Diagnosis Date  . BLURRED VISION, INTERMITTENT 09/15/2010  . HYPERLIPIDEMIA 09/15/2010  . INFECTIOUS MONONUCLEOSIS, HX OF 05/27/2007  . Rash and other nonspecific skin eruption 09/15/2010  . Asthma    No past surgical history on file.  reports that he has never smoked. He does not have any smokeless tobacco history on file. He reports that he drinks alcohol. He reports that he does not use illicit drugs. family history is not on file. Allergies  Allergen Reactions  . Penicillins Hives   Current Outpatient Prescriptions on File Prior to Visit  Medication Sig Dispense Refill  . albuterol (PROVENTIL HFA;VENTOLIN HFA) 108 (90 BASE) MCG/ACT inhaler  Inhale 2 puffs into the lungs every 6 (six) hours as needed for wheezing. 1 Inhaler 5  . diphenhydrAMINE (BENADRYL) 25 MG tablet Take 1 tablet (25 mg total) by mouth every 6 (six) hours as needed for itching. 30 tablet 0  . montelukast (SINGULAIR) 10 MG tablet Take 1 tablet (10 mg total) by mouth daily. 30 tablet 11  . Multiple Vitamin (MULTIVITAMIN) tablet Take 1 tablet by mouth daily.       No current facility-administered medications on file prior to visit.   Review of Systems  Constitutional: Negative for unusual diaphoresis or other sweats  HENT: Negative for ringing in ear Eyes: Negative for double vision or worsening visual disturbance.  Respiratory: Negative for choking and stridor.   Gastrointestinal: Negative for vomiting or other signifcant bowel change Genitourinary: Negative for hematuria or decreased urine volume.  Musculoskeletal: Negative for other MSK pain or swelling Skin: Negative for color change and worsening wound.  Neurological: Negative for tremors and numbness other than noted  Psychiatric/Behavioral: Negative for decreased concentration or agitation other than above       Objective:   Physical Exam BP 110/76 mmHg  Pulse 77  Temp(Src) 98.6 F (37 C) (Oral)  Ht  (1.778 m)  Wt 230 lb (104.327 kg)  BMI 33.00 kg/m2 VS noted, feels somewhat warm to me Constitutional: Pt appears well-developed, well-nourished.  HENT: Head: NCAT.  Right Ear: External ear normal.  Left Ear: External ear normal.  Eyes: .  Pupils are equal, round, and reactive to light. Conjunctivae and EOM are normal Bilat tm's without erythema.  Max sinus areas non tender.  Pharynx with mild erythema, no exudate, ? Mild swelling Neck: Normal range of motion. Neck supple. but with mod right lymphadenitis tender large area about the right SCM pre and post chains.  Cardiovascular: Normal rate and regular rhythm.   Pulmonary/Chest: Effort normal and breath sounds without rales or wheezing.    Abd:  Soft, ND, + BS, but with at least mod tender RLQ to mild palpation only, no guarding or rebound Neurological: Pt is alert. Not confused , motor grossly intact Skin: Skin is warm. 2 cm area left medial thigh with slight raised mult small erythem rash with minor tender No active hives at this time Psychiatric: Pt behavior is normal. No agitation.     Assessment & Plan:

## 2014-11-08 NOTE — Progress Notes (Signed)
Pre visit review using our clinic review tool, if applicable. No additional management support is needed unless otherwise documented below in the visit note. 

## 2014-11-08 NOTE — Assessment & Plan Note (Addendum)
Etiology unclear, has some ? Pharyngeal swelling but no dental issue, for levaquin for this as well as help cover abd pain issue as well, for labs as documented

## 2014-11-08 NOTE — Patient Instructions (Addendum)
You had the steroid shot today  Please take all new medication as prescribed - the antibiotic, and prednisone  Please continue all other medications as before, including the benadryl as needed  Please have the pharmacy call with any other refills you may need.  Please keep your appointments with your specialists as you may have planned  Please go to the LAB in the Basement (turn left off the elevator) for the tests to be done today - Now  Then return to the First Floor to talk [facility] (patient care coordinator) to have the CT scheduled  Please consider referral to allergist if the hives keep coming back  You will be contacted regarding the referral for: urology

## 2014-11-08 NOTE — Assessment & Plan Note (Addendum)
Cant r/o appendix, abscess or other  - for stat Abd CT/pelvic CT with CM (with stat renal labs prior) and cbc, also for levaquin for now pending results (also tx for the neck lymphadenitis as well)  Note:  Total time for pt hx, exam, review of record with pt in the room, determination of diagnoses and plan for further eval and tx is > 40 min, with over 50% spent in coordination and counseling of patient

## 2014-11-09 ENCOUNTER — Telehealth: Payer: Self-pay | Admitting: *Deleted

## 2014-11-09 ENCOUNTER — Ambulatory Visit (INDEPENDENT_AMBULATORY_CARE_PROVIDER_SITE_OTHER)
Admission: RE | Admit: 2014-11-09 | Discharge: 2014-11-09 | Disposition: A | Discharge status: Home / Self Care | Payer: Managed Care, Other (non HMO) | Source: Ambulatory Visit | Attending: Internal Medicine | Admitting: Internal Medicine

## 2014-11-09 DIAGNOSIS — R1031 Right lower quadrant pain: Secondary | ICD-10-CM

## 2014-11-09 MED ORDER — IOHEXOL 300 MG/ML  SOLN
100.0000 mL | Freq: Once | INTRAMUSCULAR | Status: AC | PRN
  Administered 2014-11-09: 100 mL via INTRAVENOUS

## 2014-11-09 NOTE — Telephone Encounter (Signed)
Stacy from [facility] called to report that CT was negative for appendicitis

## 2014-11-13 ENCOUNTER — Telehealth: Payer: Self-pay | Admitting: Internal Medicine

## 2014-11-13 ENCOUNTER — Ambulatory Visit: Payer: Managed Care, Other (non HMO) | Admitting: Internal Medicine

## 2014-11-13 DIAGNOSIS — R1031 Right lower quadrant pain: Secondary | ICD-10-CM

## 2014-11-13 NOTE — Telephone Encounter (Signed)
Spoke with patient and reviewed his lab results. He is still having right lower dull abdominal pain.  He is also breaking out in hives. He has not yet started his prednisone or the antibiotic.  He will start those today. Please advise.

## 2014-11-13 NOTE — Telephone Encounter (Signed)
Pt request 2 test result that he did on 11/09/14. Please call pt

## 2014-11-13 NOTE — Telephone Encounter (Signed)
The next step is to refer to GI - I will do, thanks

## 2014-11-13 NOTE — Telephone Encounter (Signed)
Patient is aware 

## 2014-11-28 ENCOUNTER — Ambulatory Visit (INDEPENDENT_AMBULATORY_CARE_PROVIDER_SITE_OTHER): Payer: Managed Care, Other (non HMO) | Admitting: Gastroenterology

## 2014-11-28 ENCOUNTER — Other Ambulatory Visit (INDEPENDENT_AMBULATORY_CARE_PROVIDER_SITE_OTHER): Payer: Managed Care, Other (non HMO)

## 2014-11-28 ENCOUNTER — Encounter: Payer: Self-pay | Admitting: Gastroenterology

## 2014-11-28 VITALS — BP 102/76 | HR 84 | Ht 68.5 in | Wt 229.2 lb

## 2014-11-28 DIAGNOSIS — R1011 Right upper quadrant pain: Secondary | ICD-10-CM | POA: Diagnosis not present

## 2014-11-28 DIAGNOSIS — R1031 Right lower quadrant pain: Secondary | ICD-10-CM | POA: Diagnosis not present

## 2014-11-28 DIAGNOSIS — L509 Urticaria, unspecified: Secondary | ICD-10-CM | POA: Diagnosis not present

## 2014-11-28 LAB — CBC WITH DIFFERENTIAL/PLATELET
Basophils Absolute: 0 10*3/uL (ref 0.0–0.1)
Basophils Relative: 0.3 % (ref 0.0–3.0)
EOS ABS: 0.1 10*3/uL (ref 0.0–0.7)
Eosinophils Relative: 0.5 % (ref 0.0–5.0)
HEMATOCRIT: 47.1 % (ref 39.0–52.0)
HEMOGLOBIN: 16.3 g/dL (ref 13.0–17.0)
LYMPHS ABS: 2.3 10*3/uL (ref 0.7–4.0)
Lymphocytes Relative: 17 % (ref 12.0–46.0)
MCHC: 34.6 g/dL (ref 30.0–36.0)
MCV: 93.1 fl (ref 78.0–100.0)
Monocytes Absolute: 0.7 10*3/uL (ref 0.1–1.0)
Monocytes Relative: 4.9 % (ref 3.0–12.0)
NEUTROS ABS: 10.7 10*3/uL — AB (ref 1.4–7.7)
Neutrophils Relative %: 77.3 % — ABNORMAL HIGH (ref 43.0–77.0)
Platelets: 243 10*3/uL (ref 150.0–400.0)
RBC: 5.05 Mil/uL (ref 4.22–5.81)
RDW: 13.4 % (ref 11.5–15.5)
WBC: 13.8 10*3/uL — ABNORMAL HIGH (ref 4.0–10.5)

## 2014-11-28 LAB — IGA: IGA: 226 mg/dL (ref 68–378)

## 2014-11-28 NOTE — Patient Instructions (Addendum)
Your physician has requested that you go to the basement for the lab work before leaving today.   Please follow up with Doug SouJessica Zehr, P.A.-C. Or Dr. Rob Buntinganiel Jacobs in 4-6 weeks.     I appreciate the opportunity to care for you.

## 2014-11-28 NOTE — Progress Notes (Signed)
11/28/2014 Ryan White [id number] 1986/04/13   HISTORY OF PRESENT ILLNESS:  This is a pleasant 29 year old male who has been referred here by his PCP, Dr. Jonny Ruiz, for evaluation regarding RLQ abdominal pain.  The pain has been present for the past 3 weeks; came on suddenly.  It is described as a constant dull ache, and hurts most to push on that area or with movement.  No affected by BM's or eating.  He's also had some intermittent diarrhea over the past couple of weeks as well, but says that he has been eating a lot more fruit and greens so he contributes the diarrhea to that.  He has been seeing Dr. Jonny Ruiz for hives that have been coming and going daily for the past few weeks as well.  Was treated with a course of levaquin and some prednisone.  He had a CT scan of the abdomen and pelvis with contrast that showed benign appearing abdomen and pelvis, specifically normal appearing appendix and terminal ileum.  Lipase, BMP, and hepatic function panel are unremarkable.  CBC shows slightly elevated WBC count at 12.8.  Denies any rectal bleeding or other GI complaints.   Past Medical History  Diagnosis Date  . BLURRED VISION, INTERMITTENT 09/15/2010  . HYPERLIPIDEMIA 09/15/2010  . INFECTIOUS MONONUCLEOSIS, HX OF 05/27/2007  . Rash and other nonspecific skin eruption 09/15/2010  . Asthma    History reviewed. No pertinent past surgical history.  reports that he has never smoked. He has never used smokeless tobacco. He reports that he drinks alcohol. He reports that he does not use illicit drugs. family history includes Alzheimer's disease in his paternal grandmother; Breast cancer in his mother; Diabetes in his father; Heart disease in his paternal grandfather; Hyperlipidemia in his maternal grandfather; Kidney failure in his brother. Allergies  Allergen Reactions  . Penicillins Hives      Outpatient Encounter Prescriptions as of 11/28/2014  Medication Sig  . albuterol (PROVENTIL HFA;VENTOLIN  HFA) 108 (90 BASE) MCG/ACT inhaler Inhale 2 puffs into the lungs every 6 (six) hours as needed for wheezing.  . diphenhydrAMINE (BENADRYL) 25 MG tablet Take 1 tablet (25 mg total) by mouth every 6 (six) hours as needed for itching.  . montelukast (SINGULAIR) 10 MG tablet Take 1 tablet (10 mg total) by mouth daily.  . Multiple Vitamin (MULTIVITAMIN) tablet Take 1 tablet by mouth daily.    . [DISCONTINUED] levofloxacin (LEVAQUIN) 500 MG tablet Take 1 tablet (500 mg total) by mouth daily.  . [DISCONTINUED] predniSONE (DELTASONE) 10 MG tablet 4tab by mouth per day x 3 days,3tab x 3day,2tab x 3day,1tab x 3day     REVIEW OF SYSTEMS  : All other systems reviewed and negative except where noted in the History of Present Illness.   PHYSICAL EXAM: BP 102/76 mmHg  Pulse 84  Ht 5' 8.5" (1.74 m)  Wt 229 lb 4 oz (103.987 kg)  BMI 34.35 kg/m2 General: Well developed black male in no acute distress Head: Normocephalic and atraumatic Eyes:  Sclerae anicteric, conjunctiva pink. Ears: Normal auditory acuity Lungs: Clear throughout to auscultation Heart: Regular rate and rhythm Abdomen: Soft, non-distended.  Normal bowel sounds.  Mild RLQ TTP without R/R/G; abdomen benign Musculoskeletal: Symmetrical with no gross deformities  Skin: No lesions on visible extremities Extremities: No edema  Neurological: Alert oriented x 4, grossly non-focal Psychological:  Alert and cooperative. Normal mood and affect  ASSESSMENT AND PLAN: -29 year old male with 3 week history of RLQ  abdominal pain.  Also has some intermittent diarrhea, which he contributes to recent dietary changes with more fiber.  Concurrently he has been experiencing issues with hives.  CT scan and labs unremarkable.  ? If the abdominal pain is musculoskeletal.  Will check celiac labs to rule that out as an issue.  Will also check C3 and C4 complements to rule out hereditary angioedema.  I have offered him an anti-spasmodic, however, he declined.  He  will be scheduled for follow-up to see either myself or Dr. Christella HartiganJacobs back in 4-6 weeks to reassess.     CC:  Dr. Oliver BarreJames John

## 2014-11-29 LAB — C4 COMPLEMENT: C4 Complement: 18 mg/dL (ref 10–40)

## 2014-11-29 LAB — TISSUE TRANSGLUTAMINASE, IGA: Tissue Transglutaminase Ab, IgA: 1 U/mL (ref <4)

## 2014-11-29 LAB — C3 COMPLEMENT: C3 Complement: 152 mg/dL (ref 90–180)

## 2014-11-29 NOTE — Progress Notes (Signed)
I agree with the above note, plan 

## 2014-12-10 ENCOUNTER — Telehealth: Payer: Self-pay | Admitting: Internal Medicine

## 2014-12-10 DIAGNOSIS — L509 Urticaria, unspecified: Secondary | ICD-10-CM

## 2014-12-10 NOTE — Telephone Encounter (Signed)
Is stating he is still breaking out.  Would like to know what to do next.

## 2014-12-12 NOTE — Telephone Encounter (Signed)
Notified pt with md response.../lmb 

## 2014-12-12 NOTE — Telephone Encounter (Signed)
For allergy referral - will do  Cont benadryl 50 mg every 6 hrs as needed

## 2016-05-15 ENCOUNTER — Encounter: Payer: Self-pay | Admitting: Internal Medicine

## 2016-05-15 ENCOUNTER — Other Ambulatory Visit (INDEPENDENT_AMBULATORY_CARE_PROVIDER_SITE_OTHER): Payer: Managed Care, Other (non HMO)

## 2016-05-15 ENCOUNTER — Ambulatory Visit (INDEPENDENT_AMBULATORY_CARE_PROVIDER_SITE_OTHER): Payer: Managed Care, Other (non HMO) | Admitting: Internal Medicine

## 2016-05-15 VITALS — BP 130/70 | HR 86 | Temp 98.7°F | Resp 20 | Wt 209.0 lb

## 2016-05-15 DIAGNOSIS — J4520 Mild intermittent asthma, uncomplicated: Secondary | ICD-10-CM | POA: Diagnosis not present

## 2016-05-15 DIAGNOSIS — R6889 Other general symptoms and signs: Secondary | ICD-10-CM | POA: Diagnosis not present

## 2016-05-15 DIAGNOSIS — Z202 Contact with and (suspected) exposure to infections with a predominantly sexual mode of transmission: Secondary | ICD-10-CM

## 2016-05-15 DIAGNOSIS — B86 Scabies: Secondary | ICD-10-CM

## 2016-05-15 DIAGNOSIS — Z0001 Encounter for general adult medical examination with abnormal findings: Secondary | ICD-10-CM

## 2016-05-15 LAB — HEPATIC FUNCTION PANEL
ALK PHOS: 49 U/L (ref 39–117)
ALT: 28 U/L (ref 0–53)
AST: 22 U/L (ref 0–37)
Albumin: 4.8 g/dL (ref 3.5–5.2)
BILIRUBIN TOTAL: 1.1 mg/dL (ref 0.2–1.2)
Bilirubin, Direct: 0.3 mg/dL (ref 0.0–0.3)
Total Protein: 7.8 g/dL (ref 6.0–8.3)

## 2016-05-15 LAB — CBC WITH DIFFERENTIAL/PLATELET
BASOS PCT: 0.1 % (ref 0.0–3.0)
Basophils Absolute: 0 10*3/uL (ref 0.0–0.1)
EOS PCT: 0.2 % (ref 0.0–5.0)
Eosinophils Absolute: 0 10*3/uL (ref 0.0–0.7)
HCT: 45.5 % (ref 39.0–52.0)
Hemoglobin: 15.7 g/dL (ref 13.0–17.0)
LYMPHS ABS: 1.8 10*3/uL (ref 0.7–4.0)
Lymphocytes Relative: 15.3 % (ref 12.0–46.0)
MCHC: 34.5 g/dL (ref 30.0–36.0)
MCV: 92.5 fl (ref 78.0–100.0)
MONO ABS: 0.4 10*3/uL (ref 0.1–1.0)
Monocytes Relative: 3.5 % (ref 3.0–12.0)
NEUTROS ABS: 9.7 10*3/uL — AB (ref 1.4–7.7)
NEUTROS PCT: 80.9 % — AB (ref 43.0–77.0)
Platelets: 248 10*3/uL (ref 150.0–400.0)
RBC: 4.92 Mil/uL (ref 4.22–5.81)
RDW: 13.2 % (ref 11.5–15.5)
WBC: 12 10*3/uL — ABNORMAL HIGH (ref 4.0–10.5)

## 2016-05-15 LAB — BASIC METABOLIC PANEL
BUN: 12 mg/dL (ref 6–23)
CO2: 31 mEq/L (ref 19–32)
Calcium: 9.9 mg/dL (ref 8.4–10.5)
Chloride: 105 mEq/L (ref 96–112)
Creatinine, Ser: 1.21 mg/dL (ref 0.40–1.50)
GFR: 90.84 mL/min (ref >60.00)
GLUCOSE: 84 mg/dL (ref 70–99)
POTASSIUM: 4.1 meq/L (ref 3.5–5.1)
SODIUM: 143 meq/L (ref 135–145)

## 2016-05-15 LAB — URINALYSIS, ROUTINE W REFLEX MICROSCOPIC
BILIRUBIN URINE: NEGATIVE
Hgb urine dipstick: NEGATIVE
KETONES UR: NEGATIVE
Leukocytes, UA: NEGATIVE
Nitrite: NEGATIVE
PH: 8 (ref 5.0–8.0)
Specific Gravity, Urine: 1.015 (ref 1.000–1.030)
TOTAL PROTEIN, URINE-UPE24: 30 — AB
Urine Glucose: NEGATIVE
Urobilinogen, UA: 1 (ref 0.0–1.0)

## 2016-05-15 LAB — LIPID PANEL
CHOL/HDL RATIO: 4
CHOLESTEROL: 171 mg/dL (ref 0–200)
HDL: 43.7 mg/dL (ref >39.00)
LDL CALC: 112 mg/dL — AB (ref 0–99)
NonHDL: 127.39
Triglycerides: 79 mg/dL (ref 0.0–149.0)
VLDL: 15.8 mg/dL (ref 0.0–40.0)

## 2016-05-15 LAB — TSH: TSH: 1.4 u[IU]/mL (ref 0.35–4.50)

## 2016-05-15 MED ORDER — ALBUTEROL SULFATE HFA 108 (90 BASE) MCG/ACT IN AERS: 2.0000 | INHALATION_SPRAY | Freq: Four times a day (QID) | RESPIRATORY_TRACT | 5 refills | Status: DC | PRN

## 2016-05-15 MED ORDER — MONTELUKAST SODIUM 10 MG PO TABS: 10.0000 mg | ORAL_TABLET | Freq: Every day | ORAL | 3 refills | Status: DC

## 2016-05-15 MED ORDER — PERMETHRIN 5 % EX CREA: 1.0000 "application " | TOPICAL_CREAM | Freq: Once | CUTANEOUS | 0 refills | Status: AC

## 2016-05-15 NOTE — Progress Notes (Signed)
Pre visit review using our clinic review tool, if applicable. No additional management support is needed unless otherwise documented below in the visit note. 

## 2016-05-15 NOTE — Patient Instructions (Signed)
Please take all new medication as prescribed - the cream for the infection  Please wash all clothes on Hot cycle  Your fiancee may wish to be treated as well, as she might give it back to you  Please continue all other medications as before, and refills have been done if requested.  Please have the pharmacy call with any other refills you may need.  Please continue your efforts at being more active, low cholesterol diet, and weight control.  You are otherwise up to date with prevention measures today.  Please keep your appointments with your specialists as you may have planned  Please go to the LAB in the Basement (turn left off the elevator) for the tests to be done today  You will be contacted by phone if any changes need to be made immediately.  Otherwise, you will receive a letter about your results with an explanation, but please check with MyChart first.  Please remember to sign up for MyChart if you have not done so, as this will be important to you in the future with finding out test results, communicating by private email, and scheduling acute appointments online when needed.  Please return in 1 year for your yearly visit, or sooner if needed, with Lab testing done 3-5 days before

## 2016-05-15 NOTE — Progress Notes (Signed)
Subjective:    Patient ID: Ryan White, male    DOB: 09/27/1986, 30 y.o.   MRN: [id number]  HPI  Here for wellness and f/u;  Overall doing ok;  Pt denies Chest pain, worsening SOB, DOE, wheezing, orthopnea, PND, worsening LE edema, palpitations, dizziness or syncope.  Pt denies neurological change such as new headache, facial or extremity weakness.  Pt denies polydipsia, polyuria, or low sugar symptoms. Pt states overall good compliance with treatment and medications, good tolerability, and has been trying to follow appropriate diet.  Pt denies worsening depressive symptoms, suicidal ideation or panic. No fever, night sweats, wt loss, loss of appetite, or other constitutional symptoms.  Pt states good ability with ADL's, has low fall risk, home safety reviewed and adequate, no other significant changes in hearing or vision, and only occasionally active with exercise.  Declines immunizations.  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    Does also have new rash with itching and especially itchy areas with "dark spots" between the fingers, ongoing for over 1 wk, with girlfriend having the same thing.  Has never heard of scabies.  No fever.  Does also mention requests STD evaluation, has has other contacts unprotected he is unsure of.  Denies urinary symptoms such as dysuria, frequency, urgency, flank pain, hematuria or n/v, fever, chills.  No penile d/c or ulcer Past Medical History:  Diagnosis Date  . Asthma   . BLURRED VISION, INTERMITTENT 09/15/2010  . HYPERLIPIDEMIA 09/15/2010  . INFECTIOUS MONONUCLEOSIS, HX OF 05/27/2007  . Rash and other nonspecific skin eruption 09/15/2010   No past surgical history on file.  reports that he has never smoked. He has never used smokeless tobacco. He reports that he drinks alcohol. He reports that he does not use drugs. family history includes Alzheimer's disease in his paternal grandmother; Breast cancer in his mother; Diabetes  in his father; Heart disease in his paternal grandfather; Hyperlipidemia in his maternal grandfather; Kidney failure in his brother. Allergies  Allergen Reactions  . Penicillins Hives   Current Outpatient Prescriptions on File Prior to Visit  Medication Sig Dispense Refill  . diphenhydrAMINE (BENADRYL) 25 MG tablet Take 1 tablet (25 mg total) by mouth every 6 (six) hours as needed for itching. 30 tablet 0  . Multiple Vitamin (MULTIVITAMIN) tablet Take 1 tablet by mouth daily.       No current facility-administered medications on file prior to visit.    Review of Systems Constitutional: Negative for increased diaphoresis, or other activity, appetite or siginficant weight change other than noted HENT: Negative for worsening hearing loss, ear pain, facial swelling, mouth sores and neck stiffness.   Eyes: Negative for other worsening pain, redness or visual disturbance.  Respiratory: Negative for choking or stridor Cardiovascular: Negative for other chest pain and palpitations.  Gastrointestinal: Negative for worsening diarrhea, blood in stool, or abdominal distention Genitourinary: Negative for hematuria, flank pain or change in urine volume.  Musculoskeletal: Negative for myalgias or other joint complaints.  Skin: Negative for other color change and wound or drainage.  Neurological: Negative for syncope and numbness. other than noted Hematological: Negative for adenopathy. or other swelling Psychiatric/Behavioral: Negative for hallucinations, SI, self-injury, decreased concentration or other worsening agitation.      Objective:   Physical Exam BP 130/70   Pulse 86   Temp 98.7 F (37.1 C) (Oral)   Resp 20   Wt 209 lb (94.8 kg)   SpO2 98%   BMI  31.32 kg/m  VS noted,  Constitutional: Pt is oriented to person, place, and time. Appears well-developed and well-nourished, in no significant distress Head: Normocephalic and atraumatic  Eyes: Conjunctivae and EOM are normal. Pupils are  equal, round, and reactive to light Right Ear: External ear normal.  Left Ear: External ear normal Nose: Nose normal.  Mouth/Throat: Oropharynx is clear and moist  Neck: Normal range of motion. Neck supple. No JVD present. No tracheal deviation present or significant neck LA or mass Cardiovascular: Normal rate, regular rhythm, normal heart sounds and intact distal pulses.   Pulmonary/Chest: Effort normal and breath sounds without rales or wheezing  Abdominal: Soft. Bowel sounds are normal. NT. No HSM  Musculoskeletal: Normal range of motion. Exhibits no edema Lymphadenopathy: Has no cervical adenopathy.  Neurological: Pt is alert and oriented to person, place, and time. Pt has normal reflexes. No cranial nerve deficit. Motor grossly intact Skin: Skin is warm and dry, and + for scabies type rash with burrows between fingers and several erythem random lesions to extremiteis Psychiatric:  Has normal mood and affect. Behavior is normal.  GU: normal ext genitals without ulcer, rash, swelling or d/c    Assessment & Plan:

## 2016-05-16 LAB — HEPATITIS PANEL, ACUTE
HCV AB: NEGATIVE
HEP B S AG: NEGATIVE
Hep A IgM: NONREACTIVE
Hep B C IgM: NONREACTIVE

## 2016-05-16 LAB — GC/CHLAMYDIA PROBE AMP
CT Probe RNA: NOT DETECTED
GC PROBE AMP APTIMA: NOT DETECTED

## 2016-05-16 LAB — RPR

## 2016-05-16 LAB — HIV ANTIBODY (ROUTINE TESTING W REFLEX): HIV: NONREACTIVE

## 2016-05-16 NOTE — Assessment & Plan Note (Signed)

## 2016-05-16 NOTE — Assessment & Plan Note (Addendum)
Presumptive dx, for permethrin asd, urged all clothing wash, and girlfriend and any other contacts would need exam and possible tx  In addition to the time spent performing CPE, I spent an additional 25 minutes face to face,in which greater than 50% of this time was spent in counseling and coordination of care for patient's acute illness as documented.

## 2016-05-16 NOTE — Assessment & Plan Note (Signed)
Asympt, benign but with unprotected exposure, will need STD check

## 2016-05-16 NOTE — Assessment & Plan Note (Signed)
stable overall by history and exam, recent data reviewed with pt, and pt to continue medical treatment as before,  to f/u any worsening symptoms or concerns @LASTSAO2(3)@  

## 2016-05-18 LAB — HSV 2 ANTIBODY, IGG: HSV 2 Glycoprotein G Ab, IgG: <0.9 Index (ref <0.90)

## 2020-10-20 ENCOUNTER — Emergency Department (HOSPITAL_COMMUNITY)
Admission: EM | Admit: 2020-10-20 | Discharge: 2020-10-20 | Disposition: A | Discharge status: Home / Self Care | Payer: BC Managed Care – PPO | Attending: Emergency Medicine | Admitting: Emergency Medicine

## 2020-10-20 ENCOUNTER — Emergency Department (HOSPITAL_COMMUNITY): Payer: BC Managed Care – PPO

## 2020-10-20 ENCOUNTER — Other Ambulatory Visit: Payer: Self-pay

## 2020-10-20 DIAGNOSIS — Z23 Encounter for immunization: Secondary | ICD-10-CM | POA: Diagnosis not present

## 2020-10-20 DIAGNOSIS — Y92410 Unspecified street and highway as the place of occurrence of the external cause: Secondary | ICD-10-CM | POA: Diagnosis not present

## 2020-10-20 DIAGNOSIS — T1490XA Injury, unspecified, initial encounter: Secondary | ICD-10-CM

## 2020-10-20 DIAGNOSIS — R109 Unspecified abdominal pain: Secondary | ICD-10-CM | POA: Insufficient documentation

## 2020-10-20 DIAGNOSIS — S60940A Unspecified superficial injury of right index finger, initial encounter: Secondary | ICD-10-CM | POA: Diagnosis present

## 2020-10-20 DIAGNOSIS — M79604 Pain in right leg: Secondary | ICD-10-CM | POA: Diagnosis not present

## 2020-10-20 DIAGNOSIS — S3991XA Unspecified injury of abdomen, initial encounter: Secondary | ICD-10-CM | POA: Diagnosis not present

## 2020-10-20 DIAGNOSIS — M542 Cervicalgia: Secondary | ICD-10-CM | POA: Insufficient documentation

## 2020-10-20 DIAGNOSIS — R52 Pain, unspecified: Secondary | ICD-10-CM

## 2020-10-20 DIAGNOSIS — Z041 Encounter for examination and observation following transport accident: Secondary | ICD-10-CM | POA: Diagnosis not present

## 2020-10-20 DIAGNOSIS — M25511 Pain in right shoulder: Secondary | ICD-10-CM | POA: Diagnosis not present

## 2020-10-20 DIAGNOSIS — S61216A Laceration without foreign body of right little finger without damage to nail, initial encounter: Secondary | ICD-10-CM | POA: Diagnosis not present

## 2020-10-20 DIAGNOSIS — J45909 Unspecified asthma, uncomplicated: Secondary | ICD-10-CM | POA: Insufficient documentation

## 2020-10-20 DIAGNOSIS — S61210A Laceration without foreign body of right index finger without damage to nail, initial encounter: Secondary | ICD-10-CM | POA: Diagnosis not present

## 2020-10-20 LAB — CBC WITH DIFFERENTIAL/PLATELET
Abs Immature Granulocytes: 0.11 10*3/uL — ABNORMAL HIGH (ref 0.00–0.07)
Basophils Absolute: 0.1 10*3/uL (ref 0.0–0.1)
Basophils Relative: 1 %
Eosinophils Absolute: 0 10*3/uL (ref 0.0–0.5)
Eosinophils Relative: 0 %
HCT: 47 % (ref 39.0–52.0)
Hemoglobin: 15.3 g/dL (ref 13.0–17.0)
Immature Granulocytes: 1 %
Lymphocytes Relative: 25 %
Lymphs Abs: 3.2 10*3/uL (ref 0.7–4.0)
MCH: 30.9 pg (ref 26.0–34.0)
MCHC: 32.6 g/dL (ref 30.0–36.0)
MCV: 94.9 fL (ref 80.0–100.0)
Monocytes Absolute: 0.8 10*3/uL (ref 0.1–1.0)
Monocytes Relative: 6 %
Neutro Abs: 8.8 10*3/uL — ABNORMAL HIGH (ref 1.7–7.7)
Neutrophils Relative %: 67 %
Platelets: 252 10*3/uL (ref 150–400)
RBC: 4.95 MIL/uL (ref 4.22–5.81)
RDW: 12.7 % (ref 11.5–15.5)
WBC: 13 10*3/uL — ABNORMAL HIGH (ref 4.0–10.5)
nRBC: 0 % (ref 0.0–0.2)

## 2020-10-20 LAB — BASIC METABOLIC PANEL
Anion gap: 10 (ref 5–15)
BUN: 15 mg/dL (ref 6–20)
CO2: 24 mmol/L (ref 22–32)
Calcium: 9.2 mg/dL (ref 8.9–10.3)
Chloride: 105 mmol/L (ref 98–111)
Creatinine, Ser: 1.15 mg/dL (ref 0.61–1.24)
GFR, Estimated: >60 mL/min (ref >60)
Glucose, Bld: 114 mg/dL — ABNORMAL HIGH (ref 70–99)
Potassium: 4.1 mmol/L (ref 3.5–5.1)
Sodium: 139 mmol/L (ref 135–145)

## 2020-10-20 MED ORDER — NAPROXEN 500 MG PO TABS: 500.0000 mg | ORAL_TABLET | Freq: Two times a day (BID) | ORAL | 0 refills | Status: DC

## 2020-10-20 MED ORDER — IOHEXOL 350 MG/ML SOLN
100.0000 mL | Freq: Once | INTRAVENOUS | Status: AC | PRN
  Administered 2020-10-20: 100 mL via INTRAVENOUS

## 2020-10-20 MED ORDER — TETANUS-DIPHTH-ACELL PERTUSSIS 5-2.5-18.5 LF-MCG/0.5 IM SUSY
0.5000 mL | PREFILLED_SYRINGE | Freq: Once | INTRAMUSCULAR | Status: AC
  Administered 2020-10-20: 0.5 mL via INTRAMUSCULAR
  Filled 2020-10-20: qty 0.5

## 2020-10-20 MED ORDER — LIDOCAINE HCL (PF) 1 % IJ SOLN
5.0000 mL | Freq: Once | INTRAMUSCULAR | Status: AC
  Administered 2020-10-20: 5 mL
  Filled 2020-10-20: qty 5

## 2020-10-20 MED ORDER — METHOCARBAMOL 500 MG PO TABS: 500.0000 mg | ORAL_TABLET | Freq: Two times a day (BID) | ORAL | 0 refills | Status: AC
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
## 2020-10-20 NOTE — ED Notes (Signed)
Pt transported to Xray. 
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
## 2020-10-20 NOTE — Progress Notes (Signed)
Orthopedic Tech Progress Note Patient Details:  GERARDO CAIAZZO 25-Apr-1986 [id number]  Ortho Devices Type of Ortho Device: Finger splint Ortho Device/Splint Location: Right 2nd Finger Ortho Device/Splint Interventions: Ordered,Application   Post Interventions Patient Tolerated: Well Instructions Provided: Adjustment of device,Care of device,Poper ambulation with device   Jamorris Ndiaye Clarene Reamer 10/20/2020, 12:22 PM

## 2020-11-14 ENCOUNTER — Telehealth: Payer: Self-pay | Admitting: Internal Medicine

## 2020-11-14 NOTE — Telephone Encounter (Signed)
Ok with me 

## 2020-11-14 NOTE — Telephone Encounter (Signed)
Patient hasn't been seen since 08.18.2017, is it okay for him to re-establish care?

## 2020-11-15 NOTE — Telephone Encounter (Signed)
LVM for patient to call back to make an appointment.

## 2020-11-29 ENCOUNTER — Telehealth: Payer: Self-pay | Admitting: Internal Medicine

## 2020-11-29 ENCOUNTER — Ambulatory Visit (HOSPITAL_COMMUNITY)
Admission: RE | Admit: 2020-11-29 | Discharge: 2020-11-29 | Disposition: A | Discharge status: Home / Self Care | Payer: BC Managed Care – PPO | Source: Ambulatory Visit | Attending: Cardiology | Admitting: Cardiology

## 2020-11-29 ENCOUNTER — Ambulatory Visit: Payer: BC Managed Care – PPO | Admitting: Internal Medicine

## 2020-11-29 ENCOUNTER — Encounter: Payer: Self-pay | Admitting: Internal Medicine

## 2020-11-29 ENCOUNTER — Other Ambulatory Visit: Payer: Self-pay

## 2020-11-29 VITALS — BP 122/76 | HR 73 | Ht 68.5 in | Wt 235.0 lb

## 2020-11-29 DIAGNOSIS — E538 Deficiency of other specified B group vitamins: Secondary | ICD-10-CM | POA: Diagnosis not present

## 2020-11-29 DIAGNOSIS — Z202 Contact with and (suspected) exposure to infections with a predominantly sexual mode of transmission: Secondary | ICD-10-CM | POA: Diagnosis not present

## 2020-11-29 DIAGNOSIS — R739 Hyperglycemia, unspecified: Secondary | ICD-10-CM | POA: Diagnosis not present

## 2020-11-29 DIAGNOSIS — E7800 Pure hypercholesterolemia, unspecified: Secondary | ICD-10-CM

## 2020-11-29 DIAGNOSIS — M79661 Pain in right lower leg: Secondary | ICD-10-CM | POA: Diagnosis not present

## 2020-11-29 DIAGNOSIS — N452 Orchitis: Secondary | ICD-10-CM | POA: Diagnosis not present

## 2020-11-29 DIAGNOSIS — E559 Vitamin D deficiency, unspecified: Secondary | ICD-10-CM

## 2020-11-29 DIAGNOSIS — Z0001 Encounter for general adult medical examination with abnormal findings: Secondary | ICD-10-CM

## 2020-11-29 DIAGNOSIS — M7989 Other specified soft tissue disorders: Secondary | ICD-10-CM | POA: Diagnosis not present

## 2020-11-29 LAB — CBC WITH DIFFERENTIAL/PLATELET
Basophils Absolute: 0 10*3/uL (ref 0.0–0.1)
Basophils Relative: 0.5 % (ref 0.0–3.0)
Eosinophils Absolute: 0.1 10*3/uL (ref 0.0–0.7)
Eosinophils Relative: 1.1 % (ref 0.0–5.0)
HCT: 44.7 % (ref 39.0–52.0)
Hemoglobin: 15.3 g/dL (ref 13.0–17.0)
Lymphocytes Relative: 31.8 % (ref 12.0–46.0)
Lymphs Abs: 2.9 10*3/uL (ref 0.7–4.0)
MCHC: 34.2 g/dL (ref 30.0–36.0)
MCV: 92.8 fl (ref 78.0–100.0)
Monocytes Absolute: 0.5 10*3/uL (ref 0.1–1.0)
Monocytes Relative: 5.3 % (ref 3.0–12.0)
Neutro Abs: 5.7 10*3/uL (ref 1.4–7.7)
Neutrophils Relative %: 61.3 % (ref 43.0–77.0)
Platelets: 231 10*3/uL (ref 150.0–400.0)
RBC: 4.82 Mil/uL (ref 4.22–5.81)
RDW: 13.3 % (ref 11.5–15.5)
WBC: 9.2 10*3/uL (ref 4.0–10.5)

## 2020-11-29 LAB — LIPID PANEL
Cholesterol: 179 mg/dL (ref 0–200)
HDL: 43.5 mg/dL (ref >39.00)
LDL Cholesterol: 115 mg/dL — ABNORMAL HIGH (ref 0–99)
NonHDL: 135.56
Total CHOL/HDL Ratio: 4
Triglycerides: 102 mg/dL (ref 0.0–149.0)
VLDL: 20.4 mg/dL (ref 0.0–40.0)

## 2020-11-29 LAB — URINALYSIS, ROUTINE W REFLEX MICROSCOPIC
Bilirubin Urine: NEGATIVE
Ketones, ur: NEGATIVE
Leukocytes,Ua: NEGATIVE
Nitrite: NEGATIVE
Specific Gravity, Urine: 1.02 (ref 1.000–1.030)
Total Protein, Urine: NEGATIVE
Urine Glucose: NEGATIVE
Urobilinogen, UA: 0.2 (ref 0.0–1.0)
WBC, UA: NONE SEEN (ref 0–?)
pH: 6.5 (ref 5.0–8.0)

## 2020-11-29 LAB — BASIC METABOLIC PANEL
BUN: 16 mg/dL (ref 6–23)
CO2: 30 mEq/L (ref 19–32)
Calcium: 9.7 mg/dL (ref 8.4–10.5)
Chloride: 102 mEq/L (ref 96–112)
Creatinine, Ser: 1.11 mg/dL (ref 0.40–1.50)
GFR: 86.9 mL/min (ref >60.00)
Glucose, Bld: 89 mg/dL (ref 70–99)
Potassium: 4.1 mEq/L (ref 3.5–5.1)
Sodium: 141 mEq/L (ref 135–145)

## 2020-11-29 LAB — HEPATIC FUNCTION PANEL
ALT: 37 U/L (ref 0–53)
AST: 30 U/L (ref 0–37)
Albumin: 4.6 g/dL (ref 3.5–5.2)
Alkaline Phosphatase: 50 U/L (ref 39–117)
Bilirubin, Direct: 0.2 mg/dL (ref 0.0–0.3)
Total Bilirubin: 0.9 mg/dL (ref 0.2–1.2)
Total Protein: 7.7 g/dL (ref 6.0–8.3)

## 2020-11-29 LAB — VITAMIN B12: Vitamin B-12: 756 pg/mL (ref 211–911)

## 2020-11-29 LAB — HEMOGLOBIN A1C: Hgb A1c MFr Bld: 6.1 % (ref 4.6–6.5)

## 2020-11-29 LAB — VITAMIN D 25 HYDROXY (VIT D DEFICIENCY, FRACTURES): VITD: 24.38 ng/mL — ABNORMAL LOW (ref 30.00–100.00)

## 2020-11-29 LAB — TSH: TSH: 1.01 u[IU]/mL (ref 0.35–4.50)

## 2020-11-29 MED ORDER — DOXYCYCLINE HYCLATE 100 MG PO TABS: 100.0000 mg | ORAL_TABLET | Freq: Two times a day (BID) | ORAL | 0 refills | Status: AC

## 2020-11-29 NOTE — Telephone Encounter (Signed)
Ryan White from [facility] called to give the preliminary report-   negative DBT

## 2020-11-29 NOTE — Patient Instructions (Addendum)
Please take all new medication as prescribed - the antibiotic  Please continue all other medications as before, and refills have been done if requested.  Please have the pharmacy call with any other refills you may need.  Please continue your efforts at being more active, low cholesterol diet, and weight control.  You are otherwise up to date with prevention measures today.  Please keep your appointments with your specialists as you may have planned  You will be contacted regarding the referral for: right leg vein testing for blood clot - today if possible  Please go to the LAB at the blood drawing area for the tests to be done  You will be contacted by phone if any changes need to be made immediately.  Otherwise, you will receive a letter about your results with an explanation, but please check with MyChart first.  Please remember to sign up for MyChart if you have not done so, as this will be important to you in the future with finding out test results, communicating by private email, and scheduling acute appointments online when needed.  Please make an Appointment to return for your 1 year visit, or sooner if needed
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
## 2020-11-30 ENCOUNTER — Encounter: Payer: Self-pay | Admitting: Internal Medicine

## 2020-12-01 ENCOUNTER — Encounter: Payer: Self-pay | Admitting: Internal Medicine

## 2020-12-01 NOTE — Assessment & Plan Note (Signed)
Mild to mod, for antibx course,  to f/u any worsening symptoms or concerns 

## 2020-12-01 NOTE — Assessment & Plan Note (Signed)
Lab Results  Component Value Date   HGBA1C 6.1 11/29/2020   Stable, pt to continue current medical treatment - diet and wt control

## 2020-12-01 NOTE — Assessment & Plan Note (Signed)
Lab Results  Component Value Date   LDLCALC 115 (H) 11/29/2020   Stable, pt to continue current diet, declines statin for now

## 2020-12-01 NOTE — Assessment & Plan Note (Signed)
Possible - for STD testing
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
## 2020-12-01 NOTE — Assessment & Plan Note (Signed)
Age and sex appropriate education and counseling updated with regular exercise and diet Referrals for preventative services - none needed Immunizations addressed - declnies flu and covid vax Smoking counseling  - none needed Evidence for depression or other mood disorder - none significant Most recent labs reviewed. I have personally reviewed and have noted: 1) the patient's medical and social history 2) The patient's current medications and supplements 3) The patient's height, weight, and BMI have been recorded in the chart

## 2020-12-02 ENCOUNTER — Encounter: Payer: Self-pay | Admitting: Internal Medicine

## 2020-12-02 LAB — GC/CHLAMYDIA PROBE AMP
Chlamydia trachomatis, NAA: NEGATIVE
Neisseria Gonorrhoeae by PCR: NEGATIVE

## 2020-12-02 LAB — HSV 2 ANTIBODY, IGG: HSV 2 Glycoprotein G Ab, IgG: <0.9 index

## 2020-12-02 LAB — RPR: RPR Ser Ql: NONREACTIVE

## 2020-12-02 LAB — HIV ANTIBODY (ROUTINE TESTING W REFLEX): HIV 1&2 Ab, 4th Generation: NONREACTIVE
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
## 2023-12-29 ENCOUNTER — Encounter: Payer: Self-pay | Admitting: Podiatry

## 2023-12-29 ENCOUNTER — Ambulatory Visit (INDEPENDENT_AMBULATORY_CARE_PROVIDER_SITE_OTHER): Payer: Self-pay | Admitting: Podiatry

## 2023-12-29 ENCOUNTER — Ambulatory Visit (INDEPENDENT_AMBULATORY_CARE_PROVIDER_SITE_OTHER): Payer: Self-pay

## 2023-12-29 DIAGNOSIS — M722 Plantar fascial fibromatosis: Secondary | ICD-10-CM

## 2023-12-29 DIAGNOSIS — M21622 Bunionette of left foot: Secondary | ICD-10-CM

## 2023-12-29 DIAGNOSIS — M79672 Pain in left foot: Secondary | ICD-10-CM

## 2023-12-29 MED ORDER — METHYLPREDNISOLONE 4 MG PO TBPK: ORAL_TABLET | ORAL | 0 refills | Status: AC

## 2023-12-29 MED ORDER — MELOXICAM 15 MG PO TABS: 15.0000 mg | ORAL_TABLET | Freq: Every day | ORAL | 1 refills | Status: DC

## 2023-12-29 NOTE — Progress Notes (Signed)
   Chief Complaint  Patient presents with   Foot Pain    New pt- foot pain left, heel and lateral side of foot by 5th toe    Subjective: 38 y.o. male presenting today as a new patient for evaluation of pain and tenderness associated to the left foot.  Ongoing for over 1 year now.  Denies a history of injury.  He is not on anything for treatment.   Past Medical History:  Diagnosis Date   Asthma    BLURRED VISION, INTERMITTENT 09/15/2010   HYPERLIPIDEMIA 09/15/2010   INFECTIOUS MONONUCLEOSIS, HX OF 05/27/2007   Rash and other nonspecific skin eruption 09/15/2010   History reviewed. No pertinent surgical history. Allergies  Allergen Reactions   Penicillins Hives     Objective: Physical Exam General: The patient is alert and oriented x3 in no acute distress.  Dermatology: Skin is warm, dry and supple bilateral lower extremities. Negative for open lesions or macerations bilateral.   Vascular: Dorsalis Pedis and Posterior Tibial pulses palpable bilateral.  Capillary fill time is immediate to all digits.  Neurological: Grossly intact via light touch  Musculoskeletal: Tenderness to palpation to the plantar aspect of the left heel along the plantar fascia. All other joints range of motion within normal limits bilateral. Strength 5/5 in all groups bilateral.  There is also some tenderness with palpation to the plantar aspect of the fifth MTP of the left foot which developed after the plantar fasciitis likely secondary to compensation and lateral column loading  Radiographic exam LT foot 12/29/2023: Normal osseous mineralization. Joint spaces preserved. No fracture/dislocation/boney destruction. No other soft tissue abnormalities or radiopaque foreign bodies.  Small plantar heel spur noted  Assessment: 1.  Severe plantar fasciitis left foot 2.  Small plantar heel spur left 3.  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 Xrays reviewed.   -Injection of 0.5cc Celestone soluspan injected into the left plantar fascia.  -Medrol Dosepak -meloxicam 15 mg daily after completion of the Dosepak -Plantar fascia brace dispensed.  Wear daily -OTC prefabricated power step insoles dispensed as well -Recommend [employer] -Stressed the importance and advised against going barefoot.  Patient states that he goes around the house barefoot the majority of the day.  Strongly advised against this -Return to clinic 4 weeks   Felecia Shelling, DPM [facility]  Dr. Felecia Shelling, DPM    [address] [place], [place] [address]                Office [phone]  Fax [phone]

## 2024-01-19 ENCOUNTER — Ambulatory Visit (HOSPITAL_BASED_OUTPATIENT_CLINIC_OR_DEPARTMENT_OTHER)

## 2024-01-19 ENCOUNTER — Ambulatory Visit (HOSPITAL_BASED_OUTPATIENT_CLINIC_OR_DEPARTMENT_OTHER): Admitting: Orthopaedic Surgery

## 2024-01-19 DIAGNOSIS — M79642 Pain in left hand: Secondary | ICD-10-CM

## 2024-01-19 DIAGNOSIS — M79641 Pain in right hand: Secondary | ICD-10-CM

## 2024-01-19 DIAGNOSIS — M25521 Pain in right elbow: Secondary | ICD-10-CM

## 2024-01-19 DIAGNOSIS — M7711 Lateral epicondylitis, right elbow: Secondary | ICD-10-CM | POA: Diagnosis not present

## 2024-01-19 MED ORDER — TRIAMCINOLONE ACETONIDE 40 MG/ML IJ SUSP
80.0000 mg | INTRAMUSCULAR | Status: AC | PRN
  Administered 2024-01-19: 80 mg via INTRA_ARTICULAR

## 2024-01-19 MED ORDER — LIDOCAINE HCL 1 % IJ SOLN
4.0000 mL | INTRAMUSCULAR | Status: AC | PRN
  Administered 2024-01-19: 4 mL

## 2024-01-19 NOTE — Progress Notes (Signed)
 Chief Complaint: Right elbow pain     History of Present Illness:    Ryan White is a 38 y.o. male presents with right lateral epicondylar pain which has been ongoing last several years.  He does have a history of what sounds like bilateral thumb MCP dislocations and he is very active previously in martial arts.  This was never necessarily addressed.  Does have pain about bilateral thumb MCPs.  His pain is predominately about the lateral aspect of the epicondyle of the right elbow.  All to pop and this while casting during fishing recently.    PMH/PSH/Family History/Social History/Meds/Allergies:    Past Medical History:  Diagnosis Date  . Asthma   . BLURRED VISION, INTERMITTENT 09/15/2010  . HYPERLIPIDEMIA 09/15/2010  . INFECTIOUS MONONUCLEOSIS, HX OF 05/27/2007  . Rash and other nonspecific skin eruption 09/15/2010   No past surgical history on file. Social History   Socioeconomic History  . Marital status: Single    Spouse name: Not on file  . Number of children: 0  . Years of education: Not on file  . Highest education level: Not on file  Occupational History  . Occupation: Estate manager/land agent  Tobacco Use  . Smoking status: Never  . Smokeless tobacco: Never  Substance and Sexual Activity  . Alcohol use: Yes    Alcohol/week: 0.0 standard drinks of alcohol    Comment: occasional   . Drug use: No  . Sexual activity: Never    Birth control/protection: Condom  Other Topics Concern  . Not on file  Social History Narrative  . Not on file   Social Drivers of Health   Financial Resource Strain: Not on file  Food Insecurity: Not on file  Transportation Needs: Not on file  Physical Activity: Not on file  Stress: Not on file  Social Connections: Not on file   Family History  Problem Relation Age of Onset  . Diabetes Father   . Breast cancer Mother   . Heart disease Paternal Grandfather   . Alzheimer's disease Paternal Grandmother   . Hyperlipidemia Maternal  Grandfather   . Kidney failure Brother    Allergies  Allergen Reactions  . Penicillins Hives   Current Outpatient Medications  Medication Sig Dispense Refill  . doxycycline  (VIBRA -TABS) 100 MG tablet Take 1 tablet (100 mg total) by mouth 2 (two) times daily. 20 tablet 0  . meloxicam  (MOBIC ) 15 MG tablet Take 1 tablet (15 mg total) by mouth daily. 60 tablet 1  . methocarbamol  (ROBAXIN ) 500 MG tablet Take 1 tablet (500 mg total) by mouth 2 (two) times daily. 20 tablet 0  . methylPREDNISolone  (MEDROL  DOSEPAK) 4 MG TBPK tablet 6 day dose pack - take as directed 21 tablet 0  . naproxen  (NAPROSYN ) 500 MG tablet Take 1 tablet (500 mg total) by mouth 2 (two) times daily. 30 tablet 0   No current facility-administered medications for this visit.   DG Hand Complete Left Result Date: 01/19/2024 CLINICAL DATA:  Pain in both hands. EXAM: LEFT HAND - COMPLETE 3+ VIEW COMPARISON:  None Available. FINDINGS: Bone mineralization is subjectively normal. The alignment and joint spaces are normal. No acute fracture. No erosion, periostitis or bone destruction. Unremarkable soft tissues. IMPRESSION: Normal radiographs of the left hand. Electronically Signed   By: Chadwick Colonel M.D.   On: 01/19/2024 17:02   DG Hand Complete Right Result Date: 01/19/2024 CLINICAL DATA:  Pain in both hands. EXAM: RIGHT HAND - COMPLETE 3+ VIEW COMPARISON:  Radiograph 10/20/2020 FINDINGS: Bone mineralization is subjectively normal. Normal alignment. Joint spaces are preserved. No evidence of acute fracture. Contour deformity of the fifth digit distal phalanx is suggestive of remote prior injury, stable in appearance from prior. Potential tiny erosion involving the base of the fifth proximal phalanx ulnar aspect. No periostitis or bone destruction. Unremarkable soft tissues. IMPRESSION: 1. Potential tiny erosion involving the base of the fifth proximal phalanx. This can be seen with inflammatory arthropathy. 2. Contour deformity of the  fifth digit distal phalanx is suggestive of remote prior injury. Electronically Signed   By: Chadwick Colonel M.D.   On: 01/19/2024 17:01   DG Elbow Complete Right Result Date: 01/19/2024 CLINICAL DATA:  Right elbow pain. EXAM: RIGHT ELBOW - COMPLETE 3+ VIEW COMPARISON:  None Available. FINDINGS: There is no evidence of fracture, dislocation, or joint effusion. The alignment and joint spaces are normal. There is no evidence of arthropathy or other focal bone abnormality. Soft tissues are unremarkable. IMPRESSION: Negative radiographs of the right elbow. Electronically Signed   By: Chadwick Colonel M.D.   On: 01/19/2024 16:59    Review of Systems:   A ROS was performed including pertinent positives and negatives as documented in the HPI.  Physical Exam :   Constitutional: NAD and appears stated age Neurological: Alert and oriented Psych: Appropriate affect and cooperative There were no vitals taken for this visit.   Comprehensive Musculoskeletal Exam:    Right lateral epicondyle tenderness to palpation.  Pain with resisted extension.  There is tenderness about bilateral thumb MCPs   Imaging:   Xray (3 views right elbow, 3 views right hand, 3 views left hand): Small condylar avulsion of the lateral epicondyle     I personally reviewed and interpreted the radiographs.   Assessment and Plan:   38 y.o. male with right lateral epicondylitis.  I have recommended ultrasound-guided injection of this.  I do believe this will help him significantly.  I would also like to plan to refer him to Dr. Merlinda Starling for discussion of his thumb MCPs although I am not sure if there is anything that necessarily be can be done to address these.  -Right elbow ultrasound-guided injection provided verbal consent obtained    Procedure Note  Patient: Ryan White             Date of Birth: 1986-08-14           MRN: [id number]             Visit Date: 01/19/2024  Procedures: Visit Diagnoses:  1. Pain in both  hands   2. Pain in right elbow     Medium Joint Inj: R lateral epicondyle on 01/19/2024 5:09 PM Indications: pain Details: 22 G 1.5 in needle, ultrasound-guided lateral approach Medications: 4 mL lidocaine  1 %; 80 mg triamcinolone  acetonide 40 MG/ML Outcome: tolerated well, no immediate complications Consent was given by the patient. Immediately prior to procedure a time out was called to verify the correct patient, procedure, equipment, support staff and site/side marked as required. Patient was prepped and draped in the usual sterile fashion.      I personally saw and evaluated the patient, and participated in the management and treatment plan.  Wilhelmenia Harada, MD Attending Physician, Orthopedic Surgery  This document was dictated using Dragon voice recognition software. A reasonable attempt at proof reading has been made to minimize errors.

## 2024-02-02 ENCOUNTER — Encounter: Payer: Self-pay | Admitting: Podiatry

## 2024-02-02 ENCOUNTER — Ambulatory Visit (INDEPENDENT_AMBULATORY_CARE_PROVIDER_SITE_OTHER): Payer: Self-pay | Admitting: Podiatry

## 2024-02-02 VITALS — Ht 68.5 in | Wt 235.0 lb

## 2024-02-02 DIAGNOSIS — M722 Plantar fascial fibromatosis: Secondary | ICD-10-CM

## 2024-02-02 MED ORDER — MELOXICAM 15 MG PO TABS: 15.0000 mg | ORAL_TABLET | Freq: Every day | ORAL | 1 refills | Status: AC

## 2024-02-02 NOTE — Progress Notes (Signed)
   Chief Complaint  Patient presents with   Foot Pain    Patient is here for 40M F/U for left foot plantar fasciitis,capsulitis, and heel spur Patient states feet are feeling better    Subjective: 38 y.o. male presenting today for follow-up evaluation of pain and tenderness to the left foot ongoing for over 1 year.  He feels significantly better.  Significant reduction of the pain that he was experiencing prior.   Past Medical History:  Diagnosis Date   Asthma    BLURRED VISION, INTERMITTENT 09/15/2010   HYPERLIPIDEMIA 09/15/2010   INFECTIOUS MONONUCLEOSIS, HX OF 05/27/2007   Rash and other nonspecific skin eruption 09/15/2010   History reviewed. No pertinent surgical history. Allergies  Allergen Reactions   Penicillins Hives     Objective: Physical Exam General: The patient is alert and oriented x3 in no acute distress.  Dermatology: Skin is warm, dry and supple bilateral lower extremities. Negative for open lesions or macerations bilateral.   Vascular: Dorsalis Pedis and Posterior Tibial pulses palpable bilateral.  Capillary fill time is immediate to all digits.  Neurological: Grossly intact via light touch  Musculoskeletal: There continues to be some tenderness to palpation to the plantar aspect of the left heel along the plantar fascia as well as the fifth MTP of the left foot. All other joints range of motion within normal limits bilateral. Strength 5/5 in all groups bilateral.  There is also some tenderness with palpation to the plantar aspect of the fifth MTP of the left foot which developed after the plantar fasciitis likely secondary to compensation and lateral column loading  Radiographic exam LT foot 12/29/2023: Normal osseous mineralization. Joint spaces preserved. No fracture/dislocation/boney destruction. No other soft tissue abnormalities or radiopaque foreign bodies.  Small plantar heel spur noted  Assessment: 1.  Severe plantar fasciitis left foot 2.  Small  plantar heel spur left 3.  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 Xrays reviewed.   -Injection of 0.5cc Celestone soluspan injected into the left plantar fascia as well as the fifth MTP of the left foot.  - Patient never received the meloxicam  15 mg daily.  Prescription resent -He wears the plantar fascia brace in the evenings.  Continue -Continue good supportive tennis shoes and sneakers.  He does not go barefoot around the house anymore and wears supportive crocs -Continue OTC power step insoles that were provided last visit -Return to clinic as needed   *[employer]  Dot Gazella, DPM [facility]  Dr. Dot Gazella, DPM    [address] [place], [place] [address]                Office [phone]  Fax [phone]

## 2024-02-22 ENCOUNTER — Ambulatory Visit: Admitting: Orthopedic Surgery

## 2024-03-15 ENCOUNTER — Ambulatory Visit (INDEPENDENT_AMBULATORY_CARE_PROVIDER_SITE_OTHER): Admitting: Orthopedic Surgery

## 2024-03-15 DIAGNOSIS — M79641 Pain in right hand: Secondary | ICD-10-CM | POA: Diagnosis not present

## 2024-03-15 DIAGNOSIS — M79642 Pain in left hand: Secondary | ICD-10-CM | POA: Diagnosis not present

## 2024-03-15 NOTE — Progress Notes (Signed)
 Ryan White - 38 y.o. male MRN [id number]  Date of birth: 1986-09-06  Office Visit Note: Visit Date: 03/15/2024 PCP: Roslyn Coombe, MD Referred by: Wilhelmenia Harada, MD  Subjective: No chief complaint on file.  HPI: Ryan White is a pleasant 38 y.o. male who presents today for evaluation of bilateral hand complaints.  He presents as a referral from my partner Dr. Hermina Loosen for specific hand surgical evaluation.  He is describing significant stiffness in both hands in the mornings, describes some numbness and tingling in the bilateral hands as well with associated nocturnal symptoms.  He is describing ongoing instability at the thumb MCP regions bilaterally.  Feels that he attributes the thumb MCP instability to wrestling in high school with repetitive injury to these areas.  Currently, he states he has ongoing instability in these regions with pinching grip as well as potential impact to the thumbs bilaterally.  Pertinent ROS were reviewed with the patient and found to be negative unless otherwise specified above in HPI.   Visit Reason: Bilateral thumb MCP dislocations and having stiffness in both hands. Can't make a fist in the morning-Dr. Hermina Loosen referral Duration of symptoms: years-getting worse-use to wrestle in high school Hand dominance: right Occupation: Set designer sells Diabetic: No Smoking: No Heart/Lung History: none Blood Thinners: none  Prior Testing/EMG:xrays Injections (Date): Lateral epicondylitis Treatments: no Prior Surgery: none  Assessment & Plan: Visit Diagnoses:  1. Pain in both hands     Plan: Extensive discussion was had with patient today regarding his bilateral hand complaints.  Based on his clinical examination today, I do see evidence of ongoing thumb MCP instability, particularly with radial and ulnar deviation at the MCP regions.  He may have some chronic collateral ligament injuries of the bilateral thumbs which are caused ongoing instability in these  areas.  I reviewed his recent x-rays which do show some deviation at the thumb MCP particularly on the left side which is indicative of recurrent instability as well.  From a treatment standpoint, we discussed activity modification, splinting and therapeutic exercise.  We also discussed surgical intervention  for the thumb MCP regions with exploration, possible collateral ligament reconstruction versus MCP fusions.  I did explain that the fusion would be quite extreme given his young age however this would create stability in these regions which are inherently unstable at this time.  He will accept time to consider these options.  As far as the numbness and tingling and stiffness with associated nocturnal symptoms, there may be an element of bilateral carpal tunnel syndrome.  I have recommended he utilize wrist braces for nocturnal symptoms at this time, wrist braces were provided today.  I explained that should his numbness and tingling progressed, he would be an appropriate candidate for electrodiagnostic studies of the bilateral upper extremities in order to delineate any specific sites of nerve compression.  Patient expressed full understanding.  He will return in approximately 6 to 8 weeks for repeat evaluation.  I spent 45 minutes in the care of this patient today including review of previous documentation, imaging obtained, face-to-face time discussing all options regarding treatment and documenting the encounter.   Follow-up: No follow-ups on file.   Meds & Orders: No orders of the defined types were placed in this encounter.  No orders of the defined types were placed in this encounter.    Procedures: No procedures performed      Clinical History: No specialty comments available.  He reports that he  has never smoked. He has never used smokeless tobacco. No results for input(s): HGBA1C, LABURIC in the last 8760 hours.  Objective:   Vital Signs: There were no vitals taken for this  visit.  Physical Exam  Gen: Well-appearing, in no acute distress; non-toxic CV: Regular Rate. Well-perfused. Warm.  Resp: Breathing unlabored on room air; no wheezing. Psych: Fluid speech in conversation; appropriate affect; normal thought process  Ortho Exam PHYSICAL EXAM:  General: Patient is well appearing and in no distress.  Skin and Muscle: No significant skin changes are apparent to hands.  Muscle bulk and contour normal, no signs of atrophy.     Range of Motion and Palpation Tests: Mobility is full about the elbows with flexion and extension.  Forearm supination and pronation are 85/85 bilaterally.  Wrist flexion/extension is 75/65 bilaterally.  Digital flexion and extension are full.  Thumb opposition is full to the base of the small fingers bilaterally.    Notable instability with radial and ulnar deviation at the thumb MCPs bilaterally at both neutral and 30 degrees of flexion.  Soft endpoint of the UCL notable to the right thumb, soft endpoint of the RCL notable to the left thumb.  Evidence of ongoing instability with anterior posterior subluxation as well bilateral MCPs of the thumbs, notable crepitus.  Neurologic, Vascular, Motor: Sensation is slightly diminished to light touch in the right median nerve distributions.  Tinel's testing positive right carpal tunnel.  Phalen's mildly positive bilateral, Derkan's compression mildly positive bilateral Fingers pink and well perfused.  Capillary refill is brisk.      Lab Results  Component Value Date   HGBA1C 6.1 11/29/2020     Imaging: No results found. Prior x-rays of the hands were reviewed in detail  Past Medical/Family/Surgical/Social History: Medications & Allergies reviewed per EMR, new medications updated. Patient Active Problem List   Diagnosis Date Noted   Hyperglycemia 11/29/2020   Orchitis, right 11/29/2020   Pain and swelling of right lower leg 11/29/2020   Scabies 05/15/2016   STD exposure  05/15/2016   RUQ abdominal pain 11/28/2014   RLQ abdominal pain 11/08/2014   Lymphadenitis 11/08/2014   Hives 11/08/2014   Groin pain 11/08/2014   Varicose veins of bilateral lower extremities with other complications 02/07/2014   Asthma 11/16/2011   Encounter for well adult exam with abnormal findings 10/06/2011   HLD (hyperlipidemia) 09/15/2010   BLURRED VISION, INTERMITTENT 09/15/2010   Rash and other nonspecific skin eruption 09/15/2010   INFECTIOUS MONONUCLEOSIS, HX OF 05/27/2007   Past Medical History:  Diagnosis Date   Asthma    BLURRED VISION, INTERMITTENT 09/15/2010   HYPERLIPIDEMIA 09/15/2010   INFECTIOUS MONONUCLEOSIS, HX OF 05/27/2007   Rash and other nonspecific skin eruption 09/15/2010   Family History  Problem Relation Age of Onset   Diabetes Father    Breast cancer Mother    Heart disease Paternal Grandfather    Alzheimer's disease Paternal Grandmother    Hyperlipidemia Maternal Grandfather    Kidney failure Brother    No past surgical history on file. Social History   Occupational History   Occupation: Estate manager/land agent  Tobacco Use   Smoking status: Never   Smokeless tobacco: Never  Substance and Sexual Activity   Alcohol use: Yes    Alcohol/week: 0.0 standard drinks of alcohol    Comment: occasional    Drug use: No   Sexual activity: Never    Birth control/protection: Condom    Nicolo Tomko Merlinda Starling) Marce Sensing, M.D. [facility], Hand Surgery

## 2024-05-10 ENCOUNTER — Ambulatory Visit: Admitting: Orthopedic Surgery

## 2024-06-07 ENCOUNTER — Ambulatory Visit (INDEPENDENT_AMBULATORY_CARE_PROVIDER_SITE_OTHER): Admitting: Orthopedic Surgery

## 2024-06-07 DIAGNOSIS — R200 Anesthesia of skin: Secondary | ICD-10-CM

## 2024-06-07 DIAGNOSIS — R202 Paresthesia of skin: Secondary | ICD-10-CM | POA: Diagnosis not present

## 2024-06-07 NOTE — Progress Notes (Signed)
 Ryan White - 38 y.o. male MRN [id number]  Date of birth: 05/16/1986  Office Visit Note: Visit Date: 06/07/2024 PCP: Norleen Lynwood ORN, MD Referred by: Norleen Lynwood ORN, MD  Subjective: No chief complaint on file.  HPI: Ryan White is a pleasant 38 y.o. male who returns today for evaluation of bilateral hand complaints.    He continues to describe significant stiffness in both hands in the mornings, describes some numbness and tingling in the bilateral hands as well with associated nocturnal symptoms.  He does have known ongoing instability at the thumb MCP regions bilaterally.  Feels that he attributes the thumb MCP instability to wrestling in high school with repetitive injury to these areas.  Currently, he states he has ongoing instability in these regions with pinching grip as well as potential impact to the thumbs bilaterally.  These have responded to activity modification and bracing.  Pertinent ROS were reviewed with the patient and found to be negative unless otherwise specified above in HPI.    Assessment & Plan: Visit Diagnoses:  1. Numbness and tingling in left hand   2. Numbness and tingling in right hand     Plan: Extensive discussion was had with the patient today regarding his bilateral hand numbness and tingling.  Given this persistent numbness and tingling and stiffness with associated nocturnal symptoms, there may be an element of bilateral carpal tunnel syndrome.  Symptoms have remained refractory conservative care in the form of activity modification and ongoing bracing, I have recommended that he obtain bilateral electrodiagnostic studies of the bilateral upper extremities in order to delineate any specific sites of nerve compression.  Patient expressed full understanding.  He will return i after the nerve study is complete to review results and discuss appropriate next steps.    Follow-up: No follow-ups on file.   Meds & Orders: No orders of the defined types  were placed in this encounter.   Orders Placed This Encounter  Procedures   Ambulatory referral to Physical Medicine Rehab     Procedures: No procedures performed      Clinical History: No specialty comments available.  He reports that he has never smoked. He has never used smokeless tobacco. No results for input(s): HGBA1C, LABURIC in the last 8760 hours.  Objective:   Vital Signs: There were no vitals taken for this visit.  Physical Exam  Gen: Well-appearing, in no acute distress; non-toxic CV: Regular Rate. Well-perfused. Warm.  Resp: Breathing unlabored on room air; no wheezing. Psych: Fluid speech in conversation; appropriate affect; normal thought process  Ortho Exam PHYSICAL EXAM:  General: Patient is well appearing and in no distress.  Skin and Muscle: No significant skin changes are apparent to hands.  Muscle bulk and contour normal, no signs of atrophy.     Range of Motion and Palpation Tests: Mobility is full about the elbows with flexion and extension.  Forearm supination and pronation are 85/85 bilaterally.  Wrist flexion/extension is 75/65 bilaterally.  Digital flexion and extension are full.  Thumb opposition is full to the base of the small fingers bilaterally.    Notable instability with radial and ulnar deviation at the thumb MCPs bilaterally at both neutral and 30 degrees of flexion.  Soft endpoint of the UCL notable to the right thumb, soft endpoint of the RCL notable to the left thumb.  Evidence of ongoing instability with anterior posterior subluxation as well bilateral MCPs of the thumbs, notable crepitus.  Neurologic, Vascular, Motor: Sensation  is slightly diminished to light touch in the bilateral median nerve distributions.  Tinel's testing positive bilateral carpal tunnel.  Phalen's mildly positive bilateral, Derkan's compression mildly positive bilateral Fingers pink and well perfused.  Capillary refill is brisk.      Lab Results   Component Value Date   HGBA1C 6.1 11/29/2020     Imaging: No results found. Prior x-rays of the hands were reviewed in detail  Past Medical/Family/Surgical/Social History: Medications & Allergies reviewed per EMR, new medications updated. 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/07/2014   Asthma 11/16/2011   Encounter for well adult exam with abnormal findings 10/06/2011   HLD (hyperlipidemia) 09/15/2010   BLURRED VISION, INTERMITTENT 09/15/2010   Rash and other nonspecific skin eruption 09/15/2010   INFECTIOUS MONONUCLEOSIS, HX OF 05/27/2007   Past Medical History:  Diagnosis Date   Asthma    BLURRED VISION, INTERMITTENT 09/15/2010   HYPERLIPIDEMIA 09/15/2010   INFECTIOUS MONONUCLEOSIS, HX OF 05/27/2007   Rash and other nonspecific skin eruption 09/15/2010   Family History  Problem Relation Age of Onset   Diabetes Father    Breast cancer Mother    Heart disease Paternal Grandfather    Alzheimer's disease Paternal Grandmother    Hyperlipidemia Maternal Grandfather    Kidney failure Brother    No past surgical history on file. Social History   Occupational History   Occupation: Estate manager/land agent  Tobacco Use   Smoking status: Never   Smokeless tobacco: Never  Substance and Sexual Activity   Alcohol use: Yes    Alcohol/week: 0.0 standard drinks of alcohol    Comment: occasional    Drug use: No   Sexual activity: Never    Birth control/protection: Condom    Sabastion Hrdlicka Estela) Arlinda, M.D. [facility], Hand Surgery

## 2024-06-21 ENCOUNTER — Encounter: Admitting: Physical Medicine and Rehabilitation

## 2024-07-12 ENCOUNTER — Encounter: Payer: Self-pay | Admitting: Physical Medicine and Rehabilitation

## 2024-07-12 ENCOUNTER — Ambulatory Visit: Admitting: Physical Medicine and Rehabilitation

## 2024-07-12 DIAGNOSIS — R202 Paresthesia of skin: Secondary | ICD-10-CM

## 2024-07-12 DIAGNOSIS — R29898 Other symptoms and signs involving the musculoskeletal system: Secondary | ICD-10-CM | POA: Diagnosis not present

## 2024-07-12 DIAGNOSIS — M79642 Pain in left hand: Secondary | ICD-10-CM

## 2024-07-12 DIAGNOSIS — M79641 Pain in right hand: Secondary | ICD-10-CM | POA: Diagnosis not present

## 2024-07-12 DIAGNOSIS — M25521 Pain in right elbow: Secondary | ICD-10-CM | POA: Diagnosis not present

## 2024-07-12 NOTE — Progress Notes (Signed)
 Ryan White - 38 y.o. male MRN [id number]  Date of birth: Jul 23, 1986  Office Visit Note: Visit Date: 07/12/2024 PCP: Norleen Lynwood ORN, MD Referred by: [name], MD  Subjective: No chief complaint on file.  HPI: Ryan White is a 38 y.o. male who comes in today at the request of Dr. Anshul Agarwala for evaluation and management of chronic, worsening and severe pain, numbness and tingling in the Bilateral upper extremities.  Patient is Right hand dominant.  He reports several ongoing chronic issues with both hands.  He also reports more recent right lateral elbow pain.  In terms of his hands he has a lot of pain about the bilateral thumbs and has difficulty gripping with a pinch grip do to instability.  He has had known MCP thumb instability.  He reports repetitive use of his hands with wrestling and mixed martial arts.  He also does get stiffness in the mornings with grip and moving his hands and also some numbness and tingling but it is somewhat nondermatomal and global.  He does get some nocturnal tingling and numbness.  No frank radicular symptoms down the arms.  Some neck pain but no history of cervical surgery excetra.  He has no history of diabetes or thyroid  disease.  He has tried anti-inflammatories and other conservative care without much relief.   I spent more than 30 minutes speaking face-to-face with the patient with 50% of the time in counseling and discussing coordination of care.      Review of Systems  Musculoskeletal:  Positive for joint pain.  Neurological:  Positive for tingling and focal weakness.  All other systems reviewed and are negative.  Otherwise per HPI.  Assessment & Plan: Visit Diagnoses:    ICD-10-CM   1. Paresthesia of skin  R20.2 NCV with EMG (electromyography)    2. Pain in both hands  M79.641    M79.642     3. Pain in right elbow  M25.521     4. Weakness of both hands  R29.898        Plan:Impression: The above electrodiagnostic study  is ABNORMAL and reveals evidence of a mild right median nerve entrapment at the wrist (carpal tunnel syndrome) affecting sensory components.  The low amplitudes on both median motor nerves seems related to anatomic artifact with his thumbs and electrode placement.  There is no significant electrodiagnostic evidence of any other focal nerve entrapment, brachial plexopathy or cervical radiculopathy.   Recommendations: 1.  Follow-up with referring physician. 2.  Continue current management of symptoms. 3.  Continue use of resting splint at night-time and as needed during the day.  Meds & Orders: No orders of the defined types were placed in this encounter.   Orders Placed This Encounter  Procedures   NCV with EMG (electromyography)    Follow-up: Return for Anshul Agarwala, MD.   Procedures: No procedures performed  EMG & NCV Findings: Evaluation of the left median motor and the right median motor nerves showed reduced amplitude (L4.3, R2.4 mV).  The right median (across palm) sensory nerve showed prolonged distal peak latency (Wrist, 3.8 ms).  All remaining nerves (as indicated in the following tables) were within normal limits.  All left vs. right side differences were within normal limits.    All examined muscles (as indicated in the following table) showed no evidence of electrical instability.    Impression: The above electrodiagnostic study is ABNORMAL and reveals evidence of a mild right median nerve entrapment at  the wrist (carpal tunnel syndrome) affecting sensory components.  The low amplitudes on both median motor nerves seems related to anatomic artifact with his thumbs and electrode placement.  There is no significant electrodiagnostic evidence of any other focal nerve entrapment, brachial plexopathy or cervical radiculopathy.   Recommendations: 1.  Follow-up with referring physician. 2.  Continue current management of symptoms. 3.  Continue use of resting splint at  night-time and as needed during the day.  ___________________________ Prentice Eldonna BETTERS Board Certified, American Board of Physical Medicine and Rehabilitation    Nerve Conduction Studies Anti Sensory Summary Table   Stim Site NR Peak (ms) Norm Peak (ms) P-T Amp (V) Norm P-T Amp Site1 Site2 Delta-P (ms) Dist (cm) Vel (m/s) Norm Vel (m/s)  Left Median Acr Palm Anti Sensory (2nd Digit)  31.7C  Wrist    3.2 <3.6 33.4 >10 Wrist Palm 1.6 0.0    Palm    1.6 <2.0 37.6         Right Median Acr Palm Anti Sensory (2nd Digit)  31.9C  Wrist    *3.8 <3.6 27.2 >10 Wrist Palm 2.0 0.0    Palm    1.8 <2.0 13.9         Left Radial Anti Sensory (Base 1st Digit)  31.6C  Wrist    2.0 <3.1 38.9  Wrist Base 1st Digit 2.0 0.0    Right Radial Anti Sensory (Base 1st Digit)  32C  Wrist    2.0 <3.1 18.5  Wrist Base 1st Digit 2.0 0.0    Left Ulnar Anti Sensory (5th Digit)  31.8C  Wrist    2.9 <3.7 22.2 >15.0 Wrist 5th Digit 2.9 14.0 48 >38  Right Ulnar Anti Sensory (5th Digit)  32.2C  Wrist    3.1 <3.7 16.9 >15.0 Wrist 5th Digit 3.1 14.0 45 >38   Motor Summary Table   Stim Site NR Onset (ms) Norm Onset (ms) O-P Amp (mV) Norm O-P Amp Site1 Site2 Delta-0 (ms) Dist (cm) Vel (m/s) Norm Vel (m/s)  Left Median Motor (Abd Poll Brev)  31.9C  Wrist    3.7 <4.2 *4.3 >5 Elbow Wrist 4.4 25.0 57 >50  Elbow    8.1  3.2         Right Median Motor (Abd Poll Brev)  32.1C  Wrist    4.1 <4.2 *2.4 >5 Elbow Wrist 4.4 26.0 59 >50  Elbow    8.5  3.2         Left Ulnar Motor (Abd Dig Min)  31.9C  Wrist    3.0 <4.2 11.4 >3 B Elbow Wrist 3.9 22.0 56 >53  B Elbow    6.9  9.9  A Elbow B Elbow 1.5 11.0 73 >53  A Elbow    8.4  9.7         Right Ulnar Motor (Abd Dig Min)  32.1C  Wrist    2.8 <4.2 11.3 >3 B Elbow Wrist 4.2 23.0 55 >53  B Elbow    7.0  8.9  A Elbow B Elbow 1.4 11.0 79 >53  A Elbow    8.4  8.8          EMG   Side Muscle Nerve Root Ins Act Fibs Psw Amp Dur Poly Recrt Int Bruna Comment  Right Abd Poll Brev  Median C8-T1 Nml Nml Nml Nml Nml 0 Nml Nml   Right 1stDorInt Ulnar C8-T1 Nml Nml Nml Nml Nml 0 Nml Nml   Right PronatorTeres Median C6-7 Nml  Nml Nml Nml Nml 0 Nml Nml   Right Biceps Musculocut C5-6 Nml Nml Nml Nml Nml 0 Nml Nml   Right Deltoid Axillary C5-6 Nml Nml Nml Nml Nml 0 Nml Nml     Nerve Conduction Studies Anti Sensory Left/Right Comparison   Stim Site L Lat (ms) R Lat (ms) L-R Lat (ms) L Amp (V) R Amp (V) L-R Amp (%) Site1 Site2 L Vel (m/s) R Vel (m/s) L-R Vel (m/s)  Median Acr Palm Anti Sensory (2nd Digit)  31.7C  Wrist 3.2 *3.8 0.6 33.4 27.2 18.6 Wrist Palm     Palm 1.6 1.8 0.2 37.6 13.9 63.0       Radial Anti Sensory (Base 1st Digit)  31.6C  Wrist 2.0 2.0 0.0 38.9 18.5 52.4 Wrist Base 1st Digit     Ulnar Anti Sensory (5th Digit)  31.8C  Wrist 2.9 3.1 0.2 22.2 16.9 23.9 Wrist 5th Digit 48 45 3   Motor Left/Right Comparison   Stim Site L Lat (ms) R Lat (ms) L-R Lat (ms) L Amp (mV) R Amp (mV) L-R Amp (%) Site1 Site2 L Vel (m/s) R Vel (m/s) L-R Vel (m/s)  Median Motor (Abd Poll Brev)  31.9C  Wrist 3.7 4.1 0.4 *4.3 *2.4 44.2 Elbow Wrist 57 59 2  Elbow 8.1 8.5 0.4 3.2 3.2 0.0       Ulnar Motor (Abd Dig Min)  31.9C  Wrist 3.0 2.8 0.2 11.4 11.3 0.9 B Elbow Wrist 56 55 1  B Elbow 6.9 7.0 0.1 9.9 8.9 10.1 A Elbow B Elbow 73 79 6  A Elbow 8.4 8.4 0.0 9.7 8.8 9.3          Waveforms:                     Clinical History: No specialty comments available.   He reports that he has never smoked. He has never used smokeless tobacco. No results for input(s): HGBA1C, LABURIC in the last 8760 hours.  Objective:  VS:  HT:    WT:   BMI:     BP:   HR: bpm  TEMP: ( )  RESP:  Physical Exam Vitals and nursing note reviewed.  Constitutional:      General: He is not in acute distress.    Appearance: Normal appearance. He is well-developed.  HENT:     Head: Normocephalic and atraumatic.  Eyes:     Conjunctiva/sclera: Conjunctivae normal.     Pupils: Pupils are  equal, round, and reactive to light.  Cardiovascular:     Rate and Rhythm: Normal rate.     Pulses: Normal pulses.     Heart sounds: Normal heart sounds.  Pulmonary:     Effort: Pulmonary effort is normal. No respiratory distress.  Musculoskeletal:        General: Tenderness present.     Cervical back: Normal range of motion and neck supple. No rigidity.     Right lower leg: No edema.     Left lower leg: No edema.     Comments: Inspection reveals no atrophy of the bilateral APB or FDI or hand intrinsics. There is no swelling, color changes, allodynia or dystrophic changes. There is 5 out of 5 strength in the bilateral wrist extension, finger abduction and long finger flexion. There is intact sensation to light touch in all dermatomal and peripheral nerve distributions.  There is a negative Phalen's test bilaterally. There is a negative Hoffmann's test bilaterally.  Skin:  General: Skin is warm and dry.     Findings: No erythema or rash.  Neurological:     General: No focal deficit present.     Mental Status: He is alert and oriented to person, place, and time.     Cranial Nerves: No cranial nerve deficit.     Sensory: No sensory deficit.     Motor: No weakness or abnormal muscle tone.     Coordination: Coordination normal.     Gait: Gait normal.  Psychiatric:        Mood and Affect: Mood normal.        Behavior: Behavior normal.        Thought Content: Thought content normal.     Ortho Exam  Imaging: No results found.  Past Medical/Family/Surgical/Social History: Medications & Allergies reviewed per EMR, new medications updated. Patient Active Problem List   Diagnosis Date Noted   Hyperglycemia 11/29/2020   Orchitis, right 11/29/2020   Pain and swelling of right lower leg 11/29/2020   Scabies 05/15/2016   STD exposure 05/15/2016   RUQ abdominal pain 11/28/2014   RLQ abdominal pain 11/08/2014   Lymphadenitis 11/08/2014   Hives 11/08/2014   Groin pain 11/08/2014    Varicose veins of bilateral lower extremities with other complications 02/07/2014   Asthma 11/16/2011   Encounter for well adult exam with abnormal findings 10/06/2011   HLD (hyperlipidemia) 09/15/2010   BLURRED VISION, INTERMITTENT 09/15/2010   Rash and other nonspecific skin eruption 09/15/2010   INFECTIOUS MONONUCLEOSIS, HX OF 05/27/2007   Past Medical History:  Diagnosis Date   Asthma    BLURRED VISION, INTERMITTENT 09/15/2010   HYPERLIPIDEMIA 09/15/2010   INFECTIOUS MONONUCLEOSIS, HX OF 05/27/2007   Rash and other nonspecific skin eruption 09/15/2010   Family History  Problem Relation Age of Onset   Diabetes Father    Breast cancer Mother    Heart disease Paternal Grandfather    Alzheimer's disease Paternal Grandmother    Hyperlipidemia Maternal Grandfather    Kidney failure Brother    History reviewed. No pertinent surgical history. Social History   Occupational History   Occupation: Estate manager/land agent  Tobacco Use   Smoking status: Never   Smokeless tobacco: Never  Substance and Sexual Activity   Alcohol use: Yes    Alcohol/week: 0.0 standard drinks of alcohol    Comment: occasional    Drug use: No   Sexual activity: Never    Birth control/protection: Condom

## 2024-07-12 NOTE — Procedures (Signed)
 EMG & NCV Findings: Evaluation of the left median motor and the right median motor nerves showed reduced amplitude (L4.3, R2.4 mV).  The right median (across palm) sensory nerve showed prolonged distal peak latency (Wrist, 3.8 ms).  All remaining nerves (as indicated in the following tables) were within normal limits.  All left vs. right side differences were within normal limits.    All examined muscles (as indicated in the following table) showed no evidence of electrical instability.    Impression: The above electrodiagnostic study is ABNORMAL and reveals evidence of a mild right median nerve entrapment at the wrist (carpal tunnel syndrome) affecting sensory components.  The low amplitudes on both median motor nerves seems related to anatomic artifact with his thumbs and electrode placement.  There is no significant electrodiagnostic evidence of any other focal nerve entrapment, brachial plexopathy or cervical radiculopathy.   Recommendations: 1.  Follow-up with referring physician. 2.  Continue current management of symptoms. 3.  Continue use of resting splint at night-time and as needed during the day.  ___________________________ Prentice Eldonna BETTERS Board Certified, American Board of Physical Medicine and Rehabilitation    Nerve Conduction Studies Anti Sensory Summary Table   Stim Site NR Peak (ms) Norm Peak (ms) P-T Amp (V) Norm P-T Amp Site1 Site2 Delta-P (ms) Dist (cm) Vel (m/s) Norm Vel (m/s)  Left Median Acr Palm Anti Sensory (2nd Digit)  31.7C  Wrist    3.2 <3.6 33.4 >10 Wrist Palm 1.6 0.0    Palm    1.6 <2.0 37.6         Right Median Acr Palm Anti Sensory (2nd Digit)  31.9C  Wrist    *3.8 <3.6 27.2 >10 Wrist Palm 2.0 0.0    Palm    1.8 <2.0 13.9         Left Radial Anti Sensory (Base 1st Digit)  31.6C  Wrist    2.0 <3.1 38.9  Wrist Base 1st Digit 2.0 0.0    Right Radial Anti Sensory (Base 1st Digit)  32C  Wrist    2.0 <3.1 18.5  Wrist Base 1st Digit 2.0 0.0    Left  Ulnar Anti Sensory (5th Digit)  31.8C  Wrist    2.9 <3.7 22.2 >15.0 Wrist 5th Digit 2.9 14.0 48 >38  Right Ulnar Anti Sensory (5th Digit)  32.2C  Wrist    3.1 <3.7 16.9 >15.0 Wrist 5th Digit 3.1 14.0 45 >38   Motor Summary Table   Stim Site NR Onset (ms) Norm Onset (ms) O-P Amp (mV) Norm O-P Amp Site1 Site2 Delta-0 (ms) Dist (cm) Vel (m/s) Norm Vel (m/s)  Left Median Motor (Abd Poll Brev)  31.9C  Wrist    3.7 <4.2 *4.3 >5 Elbow Wrist 4.4 25.0 57 >50  Elbow    8.1  3.2         Right Median Motor (Abd Poll Brev)  32.1C  Wrist    4.1 <4.2 *2.4 >5 Elbow Wrist 4.4 26.0 59 >50  Elbow    8.5  3.2         Left Ulnar Motor (Abd Dig Min)  31.9C  Wrist    3.0 <4.2 11.4 >3 B Elbow Wrist 3.9 22.0 56 >53  B Elbow    6.9  9.9  A Elbow B Elbow 1.5 11.0 73 >53  A Elbow    8.4  9.7         Right Ulnar Motor (Abd Dig Min)  32.1C  Wrist  2.8 <4.2 11.3 >3 B Elbow Wrist 4.2 23.0 55 >53  B Elbow    7.0  8.9  A Elbow B Elbow 1.4 11.0 79 >53  A Elbow    8.4  8.8          EMG   Side Muscle Nerve Root Ins Act Fibs Psw Amp Dur Poly Recrt Int Bruna Comment  Right Abd Poll Brev Median C8-T1 Nml Nml Nml Nml Nml 0 Nml Nml   Right 1stDorInt Ulnar C8-T1 Nml Nml Nml Nml Nml 0 Nml Nml   Right PronatorTeres Median C6-7 Nml Nml Nml Nml Nml 0 Nml Nml   Right Biceps Musculocut C5-6 Nml Nml Nml Nml Nml 0 Nml Nml   Right Deltoid Axillary C5-6 Nml Nml Nml Nml Nml 0 Nml Nml     Nerve Conduction Studies Anti Sensory Left/Right Comparison   Stim Site L Lat (ms) R Lat (ms) L-R Lat (ms) L Amp (V) R Amp (V) L-R Amp (%) Site1 Site2 L Vel (m/s) R Vel (m/s) L-R Vel (m/s)  Median Acr Palm Anti Sensory (2nd Digit)  31.7C  Wrist 3.2 *3.8 0.6 33.4 27.2 18.6 Wrist Palm     Palm 1.6 1.8 0.2 37.6 13.9 63.0       Radial Anti Sensory (Base 1st Digit)  31.6C  Wrist 2.0 2.0 0.0 38.9 18.5 52.4 Wrist Base 1st Digit     Ulnar Anti Sensory (5th Digit)  31.8C  Wrist 2.9 3.1 0.2 22.2 16.9 23.9 Wrist 5th Digit 48 45 3   Motor  Left/Right Comparison   Stim Site L Lat (ms) R Lat (ms) L-R Lat (ms) L Amp (mV) R Amp (mV) L-R Amp (%) Site1 Site2 L Vel (m/s) R Vel (m/s) L-R Vel (m/s)  Median Motor (Abd Poll Brev)  31.9C  Wrist 3.7 4.1 0.4 *4.3 *2.4 44.2 Elbow Wrist 57 59 2  Elbow 8.1 8.5 0.4 3.2 3.2 0.0       Ulnar Motor (Abd Dig Min)  31.9C  Wrist 3.0 2.8 0.2 11.4 11.3 0.9 B Elbow Wrist 56 55 1  B Elbow 6.9 7.0 0.1 9.9 8.9 10.1 A Elbow B Elbow 73 79 6  A Elbow 8.4 8.4 0.0 9.7 8.8 9.3          Waveforms:

## 2024-07-19 ENCOUNTER — Ambulatory Visit: Admitting: Orthopedic Surgery

## 2024-07-19 DIAGNOSIS — R200 Anesthesia of skin: Secondary | ICD-10-CM

## 2024-07-19 DIAGNOSIS — M25542 Pain in joints of left hand: Secondary | ICD-10-CM

## 2024-07-19 DIAGNOSIS — R202 Paresthesia of skin: Secondary | ICD-10-CM

## 2024-07-19 NOTE — Progress Notes (Signed)
 Ryan White - 38 y.o. male MRN [id number]  Date of birth: 07-25-1986  Office Visit Note: Visit Date: 07/19/2024 PCP: Norleen Lynwood ORN, MD Referred by: Norleen Lynwood ORN, MD  Subjective: No chief complaint on file.  HPI: Ryan White is a pleasant 38 y.o. male who returns today for evaluation of bilateral hand complaints.    He underwent recent EMG testing which did show mild carpal tunnel of the right side.  He continues to describe significant stiffness in both hands in the mornings, describes some numbness and tingling in the bilateral hands as well with associated nocturnal symptoms.  He does have known ongoing instability at the thumb MCP regions bilaterally.  Feels that he attributes the thumb MCP instability to wrestling in high school with repetitive injury to these areas.  Currently, he states he has ongoing instability in these regions with pinching grip as well as potential impact to the thumbs bilaterally.  These have responded to activity modification and bracing.  Pertinent ROS were reviewed with the patient and found to be negative unless otherwise specified above in HPI.    Assessment & Plan: Visit Diagnoses:  1. Pain in thumb joint with movement of left hand   2. Numbness and tingling in right hand     Plan: Extensive discussion was had with the patient today regarding his bilateral hand complaints.  We reviewed his recent electrodiagnostic testing which did show some mild carpal tunnel syndrome on the right side.  As far as the ongoing instability at the thumb MCP regions bilaterally, his left side appears to be more symptomatic on today's examination.  Given his ongoing complaints in this region, we will perform MRI study to the left hand with focus on the left thumb MCP region for potential ulnar collateral ligament injury.  He can return to me after the MRI is complete to review results and discuss appropriate next steps.    Follow-up: No follow-ups on file.    Meds & Orders: No orders of the defined types were placed in this encounter.   Orders Placed This Encounter  Procedures   MR HAND LEFT WO CONTRAST     Procedures: No procedures performed      Clinical History: No specialty comments available.  He reports that he has never smoked. He has never used smokeless tobacco. No results for input(s): HGBA1C, LABURIC in the last 8760 hours.  Objective:   Vital Signs: There were no vitals taken for this visit.  Physical Exam  Gen: Well-appearing, in no acute distress; non-toxic CV: Regular Rate. Well-perfused. Warm.  Resp: Breathing unlabored on room air; no wheezing. Psych: Fluid speech in conversation; appropriate affect; normal thought process  Ortho Exam PHYSICAL EXAM:  General: Patient is well appearing and in no distress.  Skin and Muscle: No significant skin changes are apparent to hands.  Muscle bulk and contour normal, no signs of atrophy.     Range of Motion and Palpation Tests: Mobility is full about the elbows with flexion and extension.  Forearm supination and pronation are 85/85 bilaterally.  Wrist flexion/extension is 75/65 bilaterally.  Digital flexion and extension are full.  Thumb opposition is full to the base of the small fingers bilaterally.    Notable instability with radial and ulnar deviation at the thumb MCPs bilaterally at both neutral and 30 degrees of flexion.  Soft endpoint of the UCL notable to the right thumb, soft endpoint of the UCL notable to the left thumb.  Evidence of ongoing instability with anterior posterior subluxation as well bilateral MCPs of the thumbs, notable crepitus.  Neurologic, Vascular, Motor: Sensation is slightly diminished to light touch in the bilateral median nerve distributions.  Tinel's testing positive bilateral carpal tunnel.  Phalen's mildly positive bilateral, Derkan's compression mildly positive bilateral Fingers pink and well perfused.  Capillary refill is brisk.       Lab Results  Component Value Date   HGBA1C 6.1 11/29/2020     Imaging: No results found. Prior x-rays of the hands were reviewed in detail  Past Medical/Family/Surgical/Social History: Medications & Allergies reviewed per EMR, new medications updated. 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/07/2014   Asthma 11/16/2011   Encounter for well adult exam with abnormal findings 10/06/2011   HLD (hyperlipidemia) 09/15/2010   BLURRED VISION, INTERMITTENT 09/15/2010   Rash and other nonspecific skin eruption 09/15/2010   INFECTIOUS MONONUCLEOSIS, HX OF 05/27/2007   Past Medical History:  Diagnosis Date   Asthma    BLURRED VISION, INTERMITTENT 09/15/2010   HYPERLIPIDEMIA 09/15/2010   INFECTIOUS MONONUCLEOSIS, HX OF 05/27/2007   Rash and other nonspecific skin eruption 09/15/2010   Family History  Problem Relation Age of Onset   Diabetes Father    Breast cancer Mother    Heart disease Paternal Grandfather    Alzheimer's disease Paternal Grandmother    Hyperlipidemia Maternal Grandfather    Kidney failure Brother    No past surgical history on file. Social History   Occupational History   Occupation: Estate manager/land agent  Tobacco Use   Smoking status: Never   Smokeless tobacco: Never  Substance and Sexual Activity   Alcohol use: Yes    Alcohol/week: 0.0 standard drinks of alcohol    Comment: occasional    Drug use: No   Sexual activity: Never    Birth control/protection: Condom    Lindee Leason Estela) Arlinda, M.D. [facility], Hand Surgery

## 2024-07-31 ENCOUNTER — Encounter: Payer: Self-pay | Admitting: Radiology

## 2024-08-02 ENCOUNTER — Encounter: Payer: Self-pay | Admitting: Orthopedic Surgery

## 2024-08-09 ENCOUNTER — Other Ambulatory Visit
